# Patient Record
Sex: Male | Born: 1965 | Race: White | Hispanic: No | Marital: Married | Smoking: Current every day smoker
Health system: Southern US, Community
[De-identification: ages and names within clinical notes are randomized; demographics above are authoritative.]

## PROBLEM LIST (undated history)

## (undated) DIAGNOSIS — H5462 Unqualified visual loss, left eye, normal vision right eye: Secondary | ICD-10-CM

## (undated) HISTORY — PX: KNEE SURGERY: SHX244

## (undated) HISTORY — PX: RIB PLATING: SHX5079

## (undated) HISTORY — PX: STOMACH SURGERY: SHX791

## (undated) HISTORY — PX: ARM WOUND REPAIR / CLOSURE: SUR1141

## (undated) HISTORY — PX: BACK SURGERY: SHX140

## (undated) HISTORY — DX: Unqualified visual loss, left eye, normal vision right eye: H54.62

---

## 2006-01-19 ENCOUNTER — Encounter: Admission: RE | Admit: 2006-01-19 | Discharge: 2006-01-19 | Payer: Self-pay | Admitting: Neurosurgery

## 2015-09-15 DIAGNOSIS — E782 Mixed hyperlipidemia: Secondary | ICD-10-CM | POA: Insufficient documentation

## 2015-09-15 DIAGNOSIS — K279 Peptic ulcer, site unspecified, unspecified as acute or chronic, without hemorrhage or perforation: Secondary | ICD-10-CM

## 2015-09-15 DIAGNOSIS — I2510 Atherosclerotic heart disease of native coronary artery without angina pectoris: Secondary | ICD-10-CM

## 2015-09-15 DIAGNOSIS — N529 Male erectile dysfunction, unspecified: Secondary | ICD-10-CM | POA: Insufficient documentation

## 2015-09-15 HISTORY — DX: Peptic ulcer, site unspecified, unspecified as acute or chronic, without hemorrhage or perforation: K27.9

## 2015-09-15 HISTORY — DX: Atherosclerotic heart disease of native coronary artery without angina pectoris: I25.10

## 2015-09-16 DIAGNOSIS — T63461A Toxic effect of venom of wasps, accidental (unintentional), initial encounter: Secondary | ICD-10-CM | POA: Insufficient documentation

## 2015-09-16 HISTORY — DX: Toxic effect of venom of wasps, accidental (unintentional), initial encounter: T63.461A

## 2017-03-29 DIAGNOSIS — S39012A Strain of muscle, fascia and tendon of lower back, initial encounter: Secondary | ICD-10-CM

## 2017-03-29 HISTORY — DX: Strain of muscle, fascia and tendon of lower back, initial encounter: S39.012A

## 2017-06-07 ENCOUNTER — Encounter (HOSPITAL_COMMUNITY): Payer: Self-pay

## 2017-06-07 ENCOUNTER — Emergency Department (HOSPITAL_COMMUNITY)
Admission: EM | Admit: 2017-06-07 | Discharge: 2017-06-07 | Disposition: A | Discharge status: Home / Self Care | Payer: Worker's Compensation | Attending: Emergency Medicine | Admitting: Emergency Medicine

## 2017-06-07 ENCOUNTER — Emergency Department (HOSPITAL_COMMUNITY): Payer: Worker's Compensation

## 2017-06-07 DIAGNOSIS — W230XXA Caught, crushed, jammed, or pinched between moving objects, initial encounter: Secondary | ICD-10-CM | POA: Diagnosis not present

## 2017-06-07 DIAGNOSIS — Y92530 Ambulatory surgery center as the place of occurrence of the external cause: Secondary | ICD-10-CM | POA: Insufficient documentation

## 2017-06-07 DIAGNOSIS — S62640A Nondisplaced fracture of proximal phalanx of right index finger, initial encounter for closed fracture: Secondary | ICD-10-CM | POA: Diagnosis not present

## 2017-06-07 DIAGNOSIS — Y9389 Activity, other specified: Secondary | ICD-10-CM | POA: Diagnosis not present

## 2017-06-07 DIAGNOSIS — Y990 Civilian activity done for income or pay: Secondary | ICD-10-CM | POA: Insufficient documentation

## 2017-06-07 DIAGNOSIS — S6991XA Unspecified injury of right wrist, hand and finger(s), initial encounter: Secondary | ICD-10-CM | POA: Diagnosis present

## 2017-06-07 MED ORDER — OXYCODONE-ACETAMINOPHEN 5-325 MG PO TABS: 1.0000 | ORAL_TABLET | ORAL | 0 refills | Status: AC | PRN

## 2017-06-07 MED ORDER — OXYCODONE-ACETAMINOPHEN 5-325 MG PO TABS
1.0000 | ORAL_TABLET | Freq: Once | ORAL | Status: AC
  Administered 2017-06-07: 1 via ORAL
  Filled 2017-06-07: qty 1

## 2017-06-07 NOTE — ED Notes (Signed)
Patient transported to X-ray 

## 2017-06-07 NOTE — ED Notes (Signed)
Pt verbalized understanding of discharge instructions and denies any further questions at this time.   

## 2017-06-07 NOTE — Consult Note (Signed)
Reason for Consult:Finger injury Referring Physician: E Sheffield SliderWentz  Lucas Chase is an 52 y.o. male.  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 He came to the ED where x-rays showed a comminuted proximal phalanx fx of his index finger and a small avulsion of his middle phalanx and hand surgery was consulted. 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  History reviewed. No pertinent past medical history.  History reviewed. No pertinent surgical history.  No family history on file.  Social History:  has no tobacco, alcohol, and drug history on file.  Allergies: Not on File  Medications: I have reviewed the patient's current medications.  No results found for this or any previous visit (from the past 48 hour(s)).  Dg Hand Complete Right  Result Date: 06/07/2017 CLINICAL DATA:  Work injury with pain at right index finger and swelling. EXAM: RIGHT HAND - COMPLETE 3+ VIEW COMPARISON:  None. FINDINGS: Soft tissue swelling centered about the proximal phalanx of the second digit. Oblique, nondisplaced fracture with longitudinal components, most apparent on the lateral view. No definite intra-articular extension. Favor degenerative osseous irregularity about the volar portion of the proximal portion of the middle phalanx of the second digit. IMPRESSION: Complex, relatively nondisplaced longitudinal and oblique fractures involving the proximal phalanx of the second digit. Overlying soft tissue swelling. Electronically Signed   By: Jeronimo GreavesKyle  Talbot M.D.   On: 06/07/2017 10:09    Review of Systems  Constitutional: Negative for weight loss.  HENT: Negative for ear discharge, ear pain, hearing loss and tinnitus.   Eyes: Negative for blurred vision, double vision, photophobia and pain.  Respiratory: Negative for cough, sputum production and shortness of breath.   Cardiovascular: Negative for chest pain.  Gastrointestinal:  Negative for abdominal pain, nausea and vomiting.  Genitourinary: Negative for dysuria, flank pain, frequency and urgency.  Musculoskeletal: Positive for joint pain (Right index finger). Negative for back pain, falls, myalgias and neck pain.  Neurological: Negative for dizziness, tingling, sensory change, focal weakness, loss of consciousness and headaches.  Endo/Heme/Allergies: Does not bruise/bleed easily.  Psychiatric/Behavioral: Negative for depression, memory loss and substance abuse. The patient is not nervous/anxious.    Blood pressure (!) 135/94, pulse 77, temperature 98.6 F (37 C), temperature source Oral, resp. rate 18, SpO2 100 %. Physical Exam  Constitutional: He appears well-developed and well-nourished. No distress.  HENT:  Head: Normocephalic and atraumatic.  Eyes: Conjunctivae are normal. Right eye exhibits no discharge. Left eye exhibits no discharge. No scleral icterus.  Neck: Normal range of motion.  Cardiovascular: Normal rate and regular rhythm.  Respiratory: Effort normal. No respiratory distress.  Musculoskeletal:  Right shoulder, elbow, wrist, digits- no skin wounds, index finger prox phalanx swollen, mod TTP, extensor central slip not functional, no instability, no blocks to motion  Sens  Ax/R/M/U intact except index finger paresthetic  Mot   Ax/ R/ PIN/ M/ AIN/ U intact  Rad 2+  Neurological: He is alert.  Skin: Skin is warm and dry. He is not diaphoretic.  Psychiatric: He has a normal mood and affect. His behavior is normal.    Assessment/Plan: Right index finger proximal phalanx fx -- Splint and f/u with Dr. Melvyn Novasrtmann as OP. No use of the right hand.    Lucas CaldronMichael J. Montasia Chisenhall, PA-C Orthopedic Surgery [phone] 06/07/2017, 11:38 AM

## 2017-06-07 NOTE — Progress Notes (Signed)
Orthopedic Tech Progress Note Patient Details:  Daryel GeraldJarrett W Wiater 1965-04-12 [id number]  Ortho Devices Type of Ortho Device: Finger splint Ortho Device/Splint Location: rue Ortho Device/Splint Interventions: Application   Post Interventions Patient Tolerated: Well Instructions Provided: Care of device   Nikki DomCrawford, Damieon Armendariz 06/07/2017, 11:52 AM

## 2017-06-07 NOTE — ED Notes (Signed)
Hand Surgery PA Jeffery at bedside.   Ortho paged.

## 2017-06-07 NOTE — ED Provider Notes (Signed)
[facility] EMERGENCY DEPARTMENT Provider Note   CSN: [id number] Arrival date & time: 06/07/17  0911     History   Chief Complaint No chief complaint on file.   HPI Lucas Chase is a 52 y.o. male here for evaluation of sudden onset pain to the base of the right second finger associated with decreased range of motion secondary to pain. Also noted some bruising and swelling to the area. Patient was working at [employer] when a metal piece of equipment smashed his hand in this area. Denies previous history of injuries to this hand. He is right-hand dominant. No interventions PTA.  HPI  History reviewed. No pertinent past medical history.  There are no active problems to display for this patient.   History reviewed. No pertinent surgical history.     Home Medications    Prior to Admission medications   Medication Sig Start Date End Date Taking? Authorizing Provider  oxyCODONE-acetaminophen (PERCOCET/ROXICET) 5-325 MG tablet Take 1 tablet by mouth every 4 (four) hours as needed for up to 2 days for severe pain. 06/07/17 06/09/17  Liberty Handy, PA-C    Family History No family history on file.  Social History Social History   Tobacco Use  . Smoking status: Not on file  Substance Use Topics  . Alcohol use: Not on file  . Drug use: Not on file     Allergies   Patient has no allergy information on record.   Review of Systems Review of Systems  Musculoskeletal: Positive for arthralgias and joint swelling.  Skin: Positive for color change.  All other systems reviewed and are negative.    Physical Exam Updated Vital Signs BP (!) 135/94 (BP Location: Left Arm)   Pulse 77   Temp 98.6 F (37 C) (Oral)   Resp 18   SpO2 100%   Physical Exam  Constitutional: He is oriented to person, place, and time. He appears well-developed and well-nourished.  Non-toxic appearance. No distress.  NAD.  HENT:  Head:  Normocephalic and atraumatic.  Right Ear: External ear normal.  Left Ear: External ear normal.  Nose: Nose normal.  Eyes: Conjunctivae and EOM are normal. No scleral icterus.  Neck: Normal range of motion and full passive range of motion without pain. Neck supple.  Cardiovascular: Normal rate, regular rhythm, normal heart sounds and intact distal pulses.  No murmur heard. Good cap refill to fingers bilaterally.   Pulmonary/Chest: Effort normal and breath sounds normal. No tachypnea. No respiratory distress. He has no wheezes.  Musculoskeletal: Normal range of motion. He exhibits edema and tenderness. He exhibits no deformity.  Focal tenderness, mild edema and ecchymosis to 2nd proximal phalanx and MCP. Decreased range of motion to right 2nd finger and unable to flex at IP and MCP secondary to pain. No tenderness to other MCPs or metacarpals. No bony tenderness to right wrist.   Neurological: He is alert and oriented to person, place, and time.  Sensation to light touch intact in right thumb, decreased in entire right index finger.   Skin: Skin is warm and dry. Capillary refill takes less than 2 seconds.  Psychiatric: He has a normal mood and affect. His behavior is normal. Judgment and thought content normal.  Nursing note and vitals reviewed.    ED Treatments / Results  Labs (all labs ordered are listed, but only abnormal results are displayed) Labs Reviewed - No data to display  EKG  EKG Interpretation None  Radiology Dg Hand Complete Right  Result Date: 06/07/2017 CLINICAL DATA:  Work injury with pain at right index finger and swelling. EXAM: RIGHT HAND - COMPLETE 3+ VIEW COMPARISON:  None. FINDINGS: Soft tissue swelling centered about the proximal phalanx of the second digit. Oblique, nondisplaced fracture with longitudinal components, most apparent on the lateral view. No definite intra-articular extension. Favor degenerative osseous irregularity about the volar portion of  the proximal portion of the middle phalanx of the second digit. IMPRESSION: Complex, relatively nondisplaced longitudinal and oblique fractures involving the proximal phalanx of the second digit. Overlying soft tissue swelling. Electronically Signed   By: Jeronimo GreavesKyle  Talbot M.D.   On: 06/07/2017 10:09    Procedures Procedures (including critical care time)  Medications Ordered in ED Medications  oxyCODONE-acetaminophen (PERCOCET/ROXICET) 5-325 MG per tablet 1 tablet (1 tablet Oral Given 06/07/17 1023)     Initial Impression / Assessment and Plan / ED Course  I have reviewed the triage vital signs and the nursing notes.  Pertinent labs & imaging results that were available during my care of the patient were reviewed by me and considered in my medical decision making (see chart for details).  Clinical Course as of Jun 07 1149  Wed Jun 07, 2017  1022 IMPRESSION: Complex, relatively nondisplaced longitudinal and oblique fractures involving the proximal phalanx of the second digit. Overlying soft tissue swelling. DG Hand Complete Right [CG]  1028 FINDINGS: Soft tissue swelling centered about the proximal phalanx of the second digit. Oblique, nondisplaced fracture with longitudinal components, most apparent on the lateral view. No definite intra-articular extension. Favor degenerative osseous irregularity about the volar portion of the proximal portion of the middle phalanx of the second digit.  DG Hand Complete Right [CG]    Clinical Course User Index [CG] Liberty HandyGibbons, Maitri Schnoebelen J, PA-C    Pt evaluated by ortho PA Leotis ShamesJeffery who recommends splint and f/u in office. Will dc with pain control. Discussed needed follow up with patient who verbalized understanding and agreeable.   Final Clinical Impressions(s) / ED Diagnoses   Final diagnoses:  Closed nondisplaced fracture of proximal phalanx of right index finger, initial encounter    ED Discharge Orders        Ordered    oxyCODONE-acetaminophen  (PERCOCET/ROXICET) 5-325 MG tablet  Every 4 hours PRN     06/07/17 1150       Liberty HandyGibbons, Donice Alperin J, [place]PA-C 06/07/17 1151    Mancel BaleWentz, Elliott, MD 06/07/17 2228

## 2017-06-07 NOTE — ED Triage Notes (Signed)
Patient complains of right hand pain after smashing same in metal boom. No obvious deformity, full ROM

## 2017-06-07 NOTE — ED Notes (Signed)
ED Provider at bedside. 

## 2017-06-07 NOTE — Discharge Instructions (Addendum)
Keep splint intact and dry.  Keep hand elevated whenever possible.  Ice to finger for 30 minutes 4x/day.  Percocet for pain every 4 hours. Can add an additional 650 mg tylenol every 6 hours for better pain control.   Follow up with hand surgery.

## 2017-07-11 DIAGNOSIS — M25641 Stiffness of right hand, not elsewhere classified: Secondary | ICD-10-CM

## 2017-07-11 HISTORY — DX: Stiffness of right hand, not elsewhere classified: M25.641

## 2018-09-13 DIAGNOSIS — L309 Dermatitis, unspecified: Secondary | ICD-10-CM

## 2018-09-13 DIAGNOSIS — L0100 Impetigo, unspecified: Secondary | ICD-10-CM

## 2018-09-13 HISTORY — DX: Dermatitis, unspecified: L30.9

## 2018-09-13 HISTORY — DX: Impetigo, unspecified: L01.00

## 2019-07-23 IMAGING — DX DG HAND COMPLETE 3+V*R*
4 series · 4 of 4 positions shown · non-contrast
Comparison: None.

CLINICAL DATA: Work injury with pain at right index finger and
swelling.

EXAM:
RIGHT HAND - COMPLETE 3+ VIEW

[hand pa]
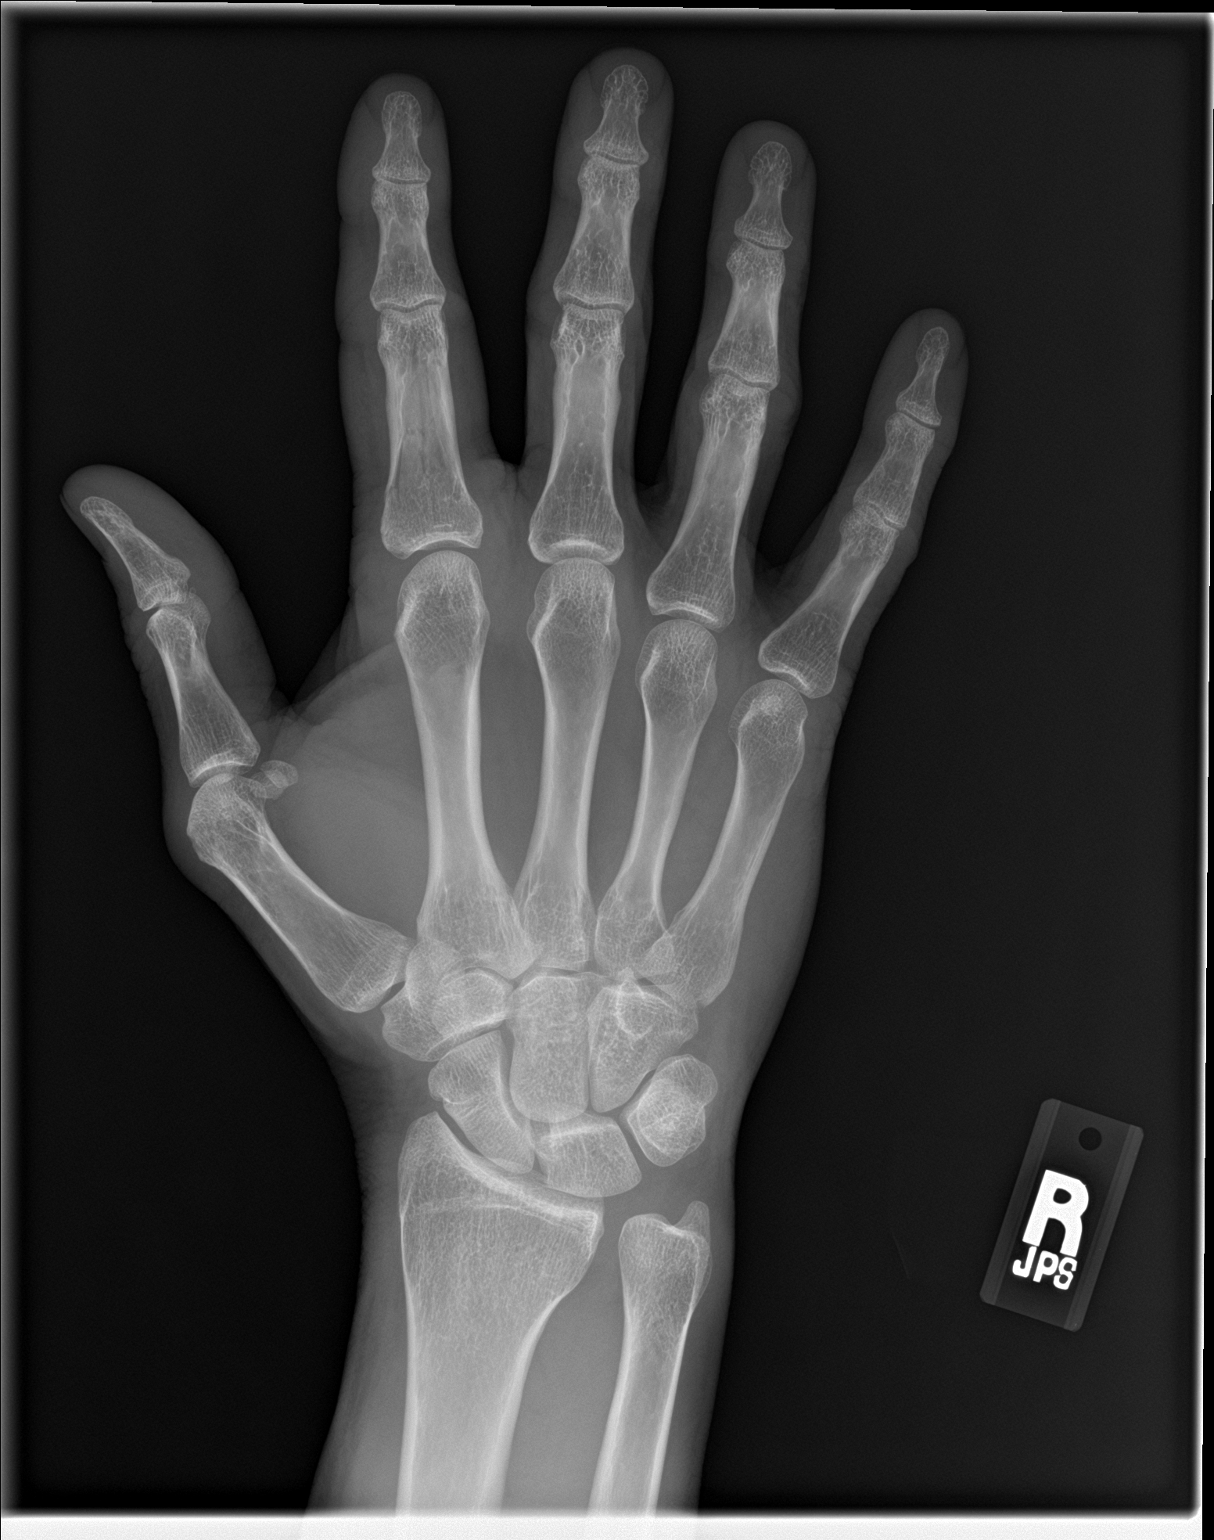

[hand obl]
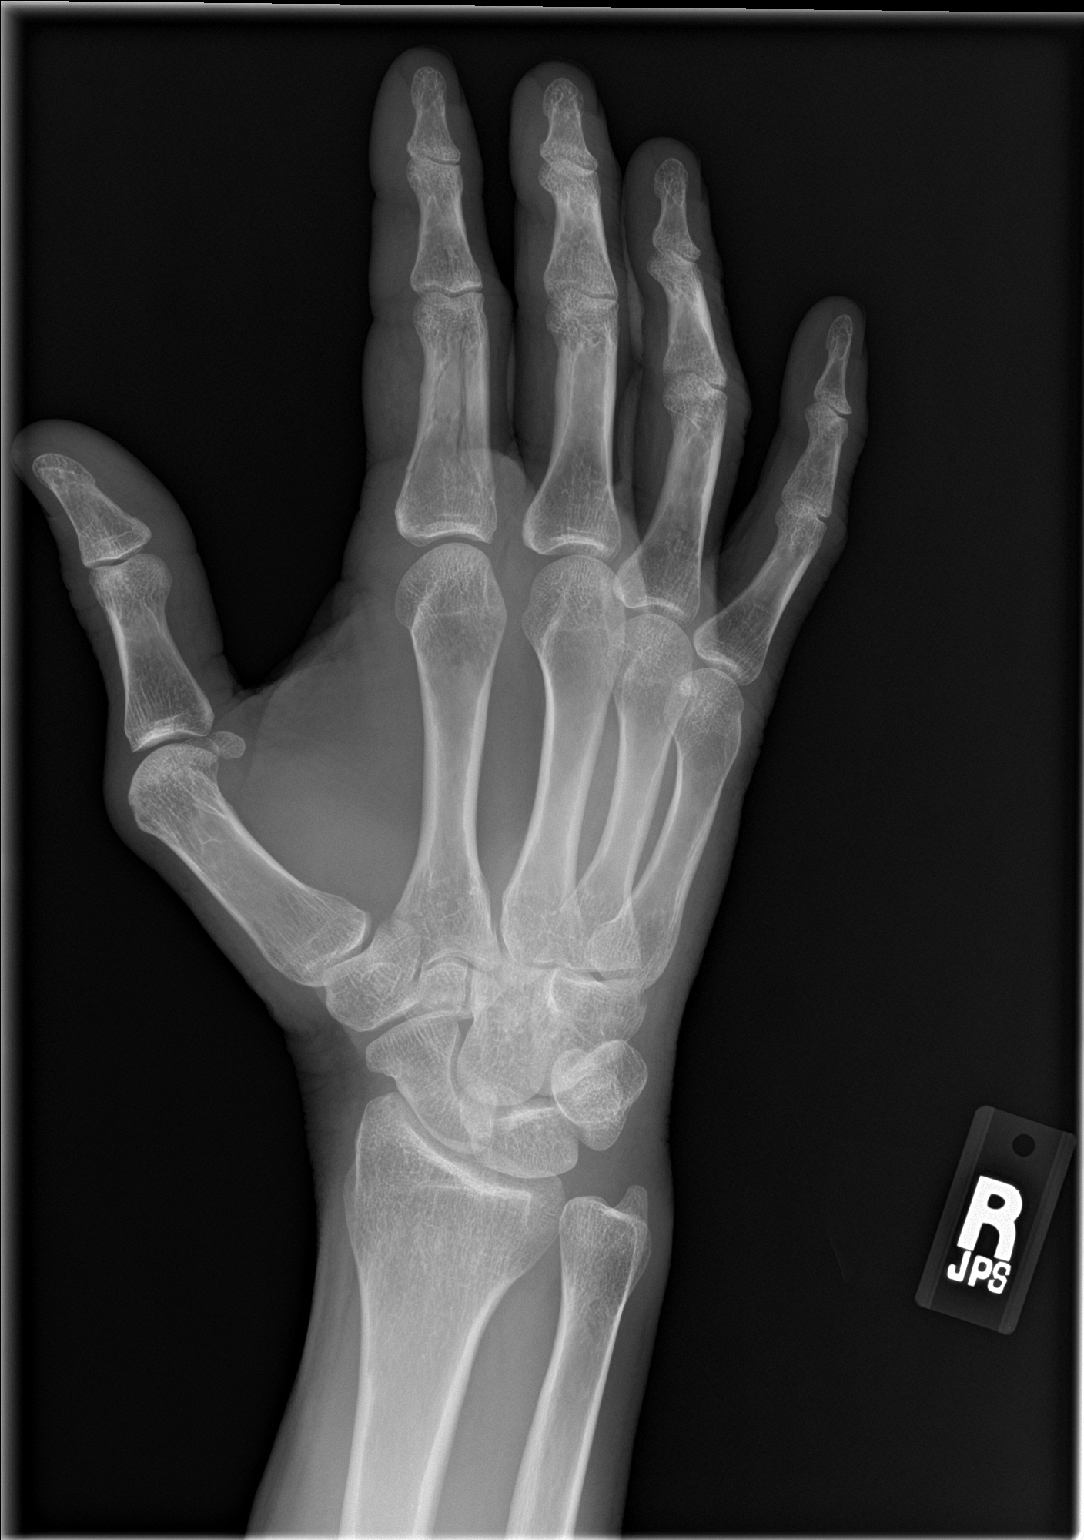

[hand lat (1 of 2)]
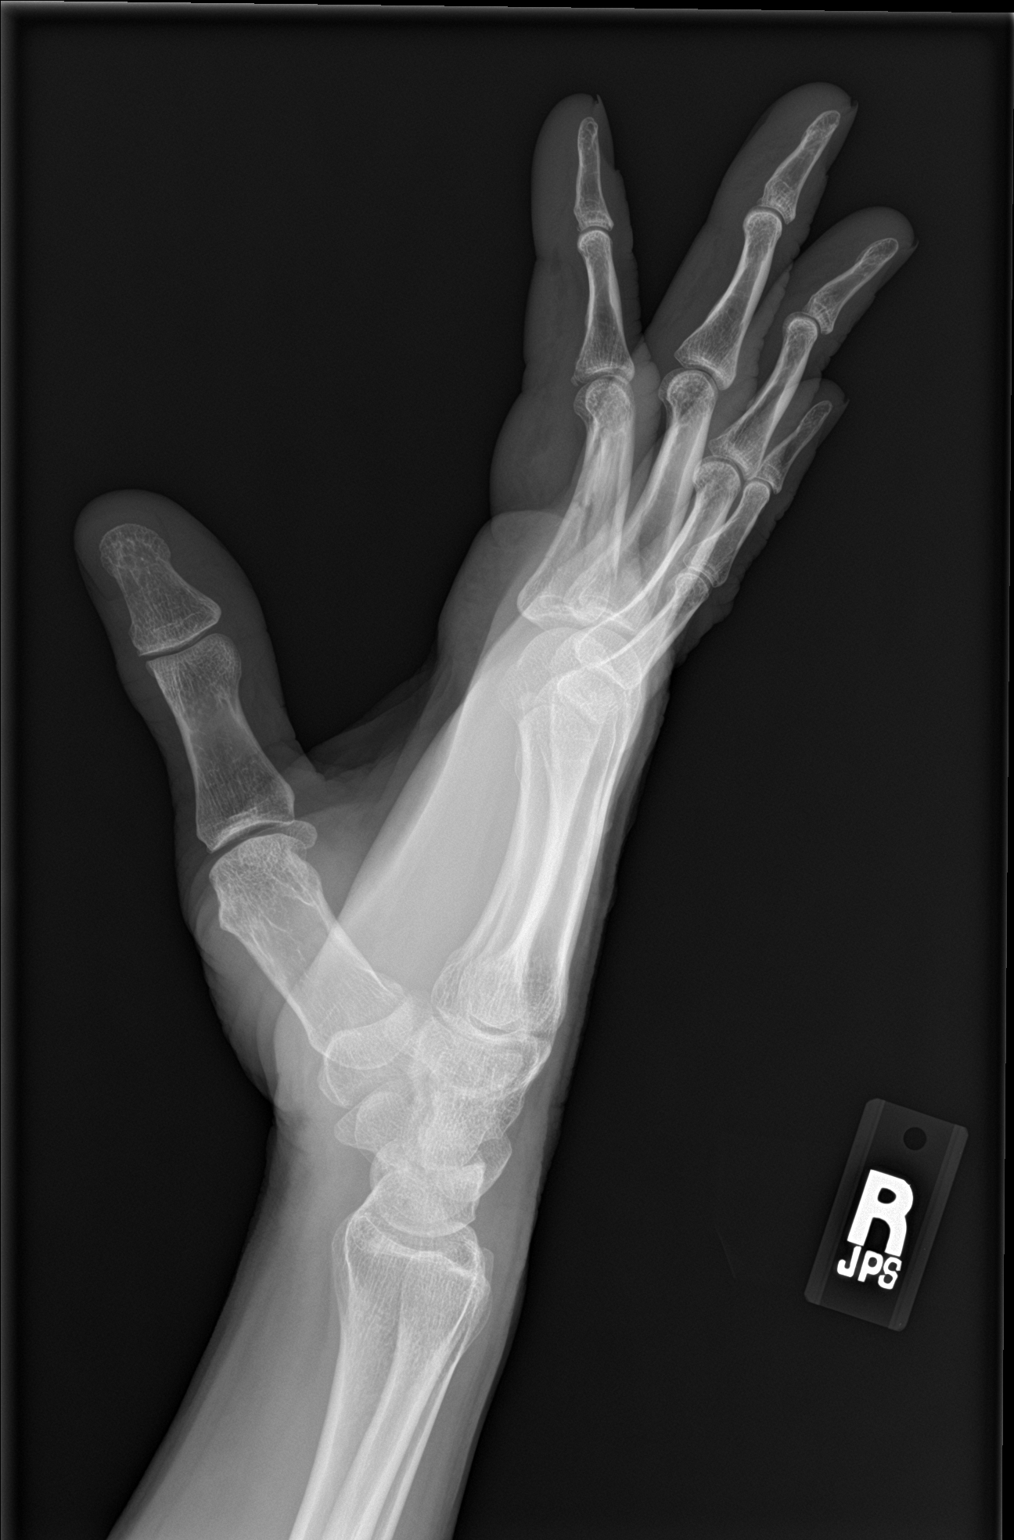

[hand lat (2 of 2)]
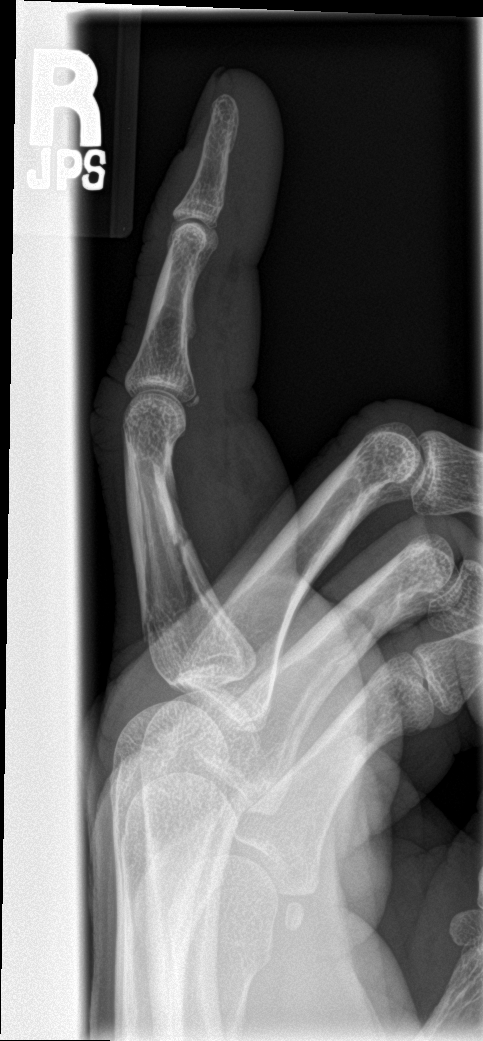

[4 of 4 positions shown; findings below may reference images not displayed]

FINDINGS: Soft tissue swelling centered about the proximal phalanx of the
second digit. Oblique, nondisplaced fracture with longitudinal
components, most apparent on the lateral view. No definite
intra-articular extension. Favor degenerative osseous irregularity
about the volar portion of the proximal portion of the middle
phalanx of the second digit.
IMPRESSION: Complex, relatively nondisplaced longitudinal and oblique fractures
involving the proximal phalanx of the second digit. Overlying soft
tissue swelling.

## 2019-12-03 DIAGNOSIS — R1310 Dysphagia, unspecified: Secondary | ICD-10-CM | POA: Insufficient documentation

## 2019-12-03 DIAGNOSIS — Z0000 Encounter for general adult medical examination without abnormal findings: Secondary | ICD-10-CM

## 2019-12-03 DIAGNOSIS — Z131 Encounter for screening for diabetes mellitus: Secondary | ICD-10-CM | POA: Insufficient documentation

## 2019-12-03 HISTORY — DX: Encounter for general adult medical examination without abnormal findings: Z00.00

## 2019-12-03 HISTORY — DX: Dysphagia, unspecified: R13.10

## 2020-01-27 DIAGNOSIS — K2960 Other gastritis without bleeding: Secondary | ICD-10-CM

## 2020-01-27 DIAGNOSIS — K222 Esophageal obstruction: Secondary | ICD-10-CM

## 2020-01-27 HISTORY — DX: Other gastritis without bleeding: K29.60

## 2020-01-27 HISTORY — DX: Esophageal obstruction: K22.2

## 2020-08-26 DIAGNOSIS — S39012A Strain of muscle, fascia and tendon of lower back, initial encounter: Secondary | ICD-10-CM

## 2020-08-26 DIAGNOSIS — S8002XA Contusion of left knee, initial encounter: Secondary | ICD-10-CM

## 2020-08-26 HISTORY — DX: Strain of muscle, fascia and tendon of lower back, initial encounter: S39.012A

## 2020-08-26 HISTORY — DX: Contusion of left knee, initial encounter: S80.02XA

## 2022-02-22 ENCOUNTER — Other Ambulatory Visit: Payer: Self-pay

## 2022-02-22 ENCOUNTER — Emergency Department (HOSPITAL_COMMUNITY)
Admission: EM | Admit: 2022-02-22 | Discharge: 2022-02-22 | Disposition: A | Discharge status: Home / Self Care | Payer: 59 | Attending: Emergency Medicine | Admitting: Emergency Medicine

## 2022-02-22 ENCOUNTER — Encounter (HOSPITAL_COMMUNITY): Payer: Self-pay

## 2022-02-22 DIAGNOSIS — I471 Supraventricular tachycardia, unspecified: Secondary | ICD-10-CM | POA: Diagnosis not present

## 2022-02-22 DIAGNOSIS — R42 Dizziness and giddiness: Secondary | ICD-10-CM | POA: Diagnosis present

## 2022-02-22 LAB — COMPREHENSIVE METABOLIC PANEL
ALT: 21 U/L (ref 0–44)
AST: 34 U/L (ref 15–41)
Albumin: 3.7 g/dL (ref 3.5–5.0)
Alkaline Phosphatase: 65 U/L (ref 38–126)
Anion gap: 12 (ref 5–15)
BUN: 7 mg/dL (ref 6–20)
CO2: 22 mmol/L (ref 22–32)
Calcium: 8.9 mg/dL (ref 8.9–10.3)
Chloride: 105 mmol/L (ref 98–111)
Creatinine, Ser: 1.05 mg/dL (ref 0.61–1.24)
GFR, Estimated: >60 mL/min (ref >60)
Glucose, Bld: 100 mg/dL — ABNORMAL HIGH (ref 70–99)
Potassium: 4.7 mmol/L (ref 3.5–5.1)
Sodium: 139 mmol/L (ref 135–145)
Total Bilirubin: 0.5 mg/dL (ref 0.3–1.2)
Total Protein: 6.4 g/dL — ABNORMAL LOW (ref 6.5–8.1)

## 2022-02-22 LAB — CBC
HCT: 42.7 % (ref 39.0–52.0)
Hemoglobin: 13.9 g/dL (ref 13.0–17.0)
MCH: 33.6 pg (ref 26.0–34.0)
MCHC: 32.6 g/dL (ref 30.0–36.0)
MCV: 103.1 fL — ABNORMAL HIGH (ref 80.0–100.0)
Platelets: 201 10*3/uL (ref 150–400)
RBC: 4.14 MIL/uL — ABNORMAL LOW (ref 4.22–5.81)
RDW: 13.8 % (ref 11.5–15.5)
WBC: 8.1 10*3/uL (ref 4.0–10.5)
nRBC: 0 % (ref 0.0–0.2)

## 2022-02-22 LAB — TROPONIN I (HIGH SENSITIVITY): Troponin I (High Sensitivity): 11 ng/L (ref <18)

## 2022-02-22 LAB — TSH: TSH: 1.077 u[IU]/mL (ref 0.350–4.500)

## 2022-02-22 NOTE — Discharge Instructions (Signed)
Get help right away if: You have chest pain. Your symptoms get worse. You have trouble breathing. You have an episode of SVT that lasts longer than 20 minutes. You faint. These symptoms may represent a serious problem that is an emergency. Do not wait to see if the symptoms will go away. Get medical help right away. Call your local emergency services (911 in the [place] Do not drive yourself to the hospital.

## 2022-02-22 NOTE — ED Provider Notes (Signed)
[facility] EMERGENCY DEPARTMENT Provider Note   CSN: [id number] Arrival date & time: 02/22/22  1152     History  Chief Complaint  Patient presents with   Dizziness    At work had sudden onset of dizziness and felt like he was going to have a syncopal episode. Upon EMS arrival HR was in 190s. Pt spontaneously converted prior to med administration    DAMARIEN Chase is a 56 y.o. male with no significant past medical history.Lucas Chase  History is gathered by the patient and EMS upon arrival.  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  He turned off his computer and then asked his foreman to walk with him to the nurses office.  He states that as he was walking down the hall he had onset of severe and progressively worsening chest tightness and pressure along with pain retrosternally and sensation of acid reflux and heartburn.  He states that he also felt quite short of breath.  By the time he got to the office nurse took his vital signs and called EMS immediately.  EMS reports that upon arrival his heart rate was noted to be 199 bpm and he appeared to be in SVT.  Initial blood pressure was 110.  As they were wheeling him out of the office the patient got startled by something and spontaneously converted to normal sinus rhythm.  He has no previous history of the same.  He had unsweetened tea at breakfast this morning.  He drinks no other caffeine takes no stimulants.  Drinks alcohol only very occasionally and does not smoke.  He also denies any illicit drug use or other stimulant use   Dizziness      Home Medications Prior to Admission medications   Not on File      Allergies    Patient has no known allergies.    Review of Systems   Review of Systems  Neurological:  Positive for dizziness.    Physical Exam Updated Vital Signs BP (!) 133/96   Pulse 79   Temp 98.8  F (37.1 C) (Oral)   Resp 17   Ht 6\' 4"  (1.93 m)   Wt 81.6 kg   SpO2 98%   BMI 21.91 kg/m  Physical Exam Vitals and nursing note reviewed.  Constitutional:      General: He is not in acute distress.    Appearance: He is well-developed. He is not diaphoretic.  HENT:     Head: Normocephalic and atraumatic.  Eyes:     General: No scleral icterus.    Conjunctiva/sclera: Conjunctivae normal.  Cardiovascular:     Rate and Rhythm: Normal rate and regular rhythm.     Heart sounds: Normal heart sounds.  Pulmonary:     Effort: Pulmonary effort is normal. No respiratory distress.     Breath sounds: Normal breath sounds.  Abdominal:     Palpations: Abdomen is soft.     Tenderness: There is no abdominal tenderness.  Musculoskeletal:     Cervical back: Normal range of motion and neck supple.  Skin:    General: Skin is warm and dry.  Neurological:     Mental Status: He is alert.  Psychiatric:        Behavior: Behavior normal.     ED Results / Procedures / Treatments   Labs (all labs ordered are listed, but only abnormal  results are displayed) Labs Reviewed  COMPREHENSIVE METABOLIC PANEL - Abnormal; Notable for the following components:      Result Value   Glucose, Bld 100 (*)    Total Protein 6.4 (*)    All other components within normal limits  CBC - Abnormal; Notable for the following components:   RBC 4.14 (*)    MCV 103.1 (*)    All other components within normal limits  TSH  ETHANOL  TROPONIN I (HIGH SENSITIVITY)  TROPONIN I (HIGH SENSITIVITY)    EKG EKG Interpretation  Date/Time:  Tuesday February 22 2022 11:57:22 EST Ventricular Rate:  93 PR Interval:  156 QRS Duration: 82 QT Interval:  360 QTC Calculation: 448 R Axis:   57 Text Interpretation: Sinus rhythm Anteroseptal infarct, old No old tracing to compare Confirmed by Jacalyn Lefevre [phone]) on 02/22/2022 12:05:10 PM  Radiology No results found.  Procedures Procedures    Medications Ordered in  ED Medications - No data to display  ED Course/ Medical Decision Making/ A&P Clinical Course as of 02/22/22 1634  Tue Feb 22, 2022  1456 Troponin I (High Sensitivity) [AH]  1456 Comprehensive metabolic panel(!) [AH]    Clinical Course User Index [AH] Arthor Captain, PA-C                           Medical Decision Making Patient presents the emergency department with chief complaint of chest pain The emergent differential diagnosis of chest pain includes: Acute coronary syndrome, pericarditis, aortic dissection, pulmonary embolism, tension pneumothorax, pneumonia, and esophageal rupture.    After review of all data points I suspect the patient had an episode of SVT given his heart rate was 199.  I also suspect that his anginal symptoms were secondary to demand ischemia. I have interpreted all lab results.  TSH within normal limits.  CBC and CMP without significant abnormality.  EKG shows normal sinus rhythm at a rate of 93.  Troponin is within normal limits.  He has had no recurrence of symptoms, no chest pain, no shortness of breath.  Discussed vagal maneuvers with the patient.  He is given ambulatory referral to cardiology for further evaluation and management.  Discussed return precautions.  Amount and/or Complexity of Data Reviewed Labs: ordered. Decision-making details documented in ED Course.           Final Clinical Impression(s) / ED Diagnoses Final diagnoses:  SVT (supraventricular tachycardia)    Rx / DC Orders ED Discharge Orders          Ordered    Ambulatory referral to Cardiology       Comments: If you have not heard from the Cardiology office within the next 72 hours please call [phone].   02/22/22 1517              Arthor Captain, PA-C 02/22/22 1635    Jacalyn Lefevre, MD 02/23/22 [phone]

## 2022-02-22 NOTE — ED Notes (Signed)
Help get patient into a gown on the monitor patient is resting with call bell in reach 

## 2022-03-10 ENCOUNTER — Other Ambulatory Visit: Payer: Self-pay

## 2022-03-14 ENCOUNTER — Ambulatory Visit: Payer: 59 | Attending: Cardiology | Admitting: Cardiology

## 2022-03-14 ENCOUNTER — Telehealth: Payer: Self-pay | Admitting: Cardiology

## 2022-03-14 ENCOUNTER — Encounter: Payer: Self-pay | Admitting: Cardiology

## 2022-03-14 VITALS — BP 124/72 | HR 105 | Ht 76.0 in | Wt 176.4 lb

## 2022-03-14 DIAGNOSIS — R0609 Other forms of dyspnea: Secondary | ICD-10-CM | POA: Diagnosis not present

## 2022-03-14 DIAGNOSIS — I471 Supraventricular tachycardia, unspecified: Secondary | ICD-10-CM

## 2022-03-14 DIAGNOSIS — E785 Hyperlipidemia, unspecified: Secondary | ICD-10-CM

## 2022-03-14 DIAGNOSIS — K279 Peptic ulcer, site unspecified, unspecified as acute or chronic, without hemorrhage or perforation: Secondary | ICD-10-CM

## 2022-03-14 DIAGNOSIS — I2510 Atherosclerotic heart disease of native coronary artery without angina pectoris: Secondary | ICD-10-CM | POA: Diagnosis not present

## 2022-03-14 DIAGNOSIS — R079 Chest pain, unspecified: Secondary | ICD-10-CM

## 2022-03-14 HISTORY — DX: Hyperlipidemia, unspecified: E78.5

## 2022-03-14 HISTORY — DX: Supraventricular tachycardia, unspecified: I47.10

## 2022-03-14 MED ORDER — CLOPIDOGREL BISULFATE 75 MG PO TABS: 75.0000 mg | ORAL_TABLET | Freq: Every day | ORAL | 3 refills | Status: DC

## 2022-03-14 MED ORDER — METOPROLOL SUCCINATE ER 50 MG PO TB24: 50.0000 mg | ORAL_TABLET | Freq: Every day | ORAL | 3 refills | Status: DC

## 2022-03-14 MED ORDER — NITROGLYCERIN 0.4 MG SL SUBL: 0.4000 mg | SUBLINGUAL_TABLET | SUBLINGUAL | 6 refills | Status: DC | PRN

## 2022-03-14 NOTE — Progress Notes (Signed)
Cardiology Consultation:    Date:  03/14/2022   ID:  Lucas Chase, DOB Dec 09, 1965, MRN [id number]  PCP:  Olive Bass, MD  Cardiologist:  Gypsy Balsam, MD   Referring MD: Arthor Captain, PA-C   Chief Complaint  Patient presents with   svt    Seen at ER    History of Present Illness:    Lucas Chase is a 56 y.o. male who is being seen today for the evaluation of SVT, chest pain at the request of Arthor Captain, PA-C.  Past medical history significant for coronary artery disease apparently 2014 he had cardiac catheterization done which showed only 20% RCA and 20% LAD lesion.  No therapy has been initiated.  Interestingly he has not been taking aspirin, he has not been taking any statin.  He has been doing quite well with no difficulties he does have also remote history of gastrectomy done secondary to bleeding ulcer he also got portion of his duodenum taken away.  Few weeks ago he was working at [employer], started feeling poorly.  Eventually end up going to the nurse his blood pressure initially was very high but then very low he also started sweating and having chest tightness.  Ambulance was summoned, when he came apparently he was in SVT.  While driving to the hospital he converted to sinus rhythm in the hospital EKG did not show any acute changes, troponins were negative he was discharged home.  Since that time doing well.  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  Described patient still fatigue and shortness of breath.  Does not smoke, does have family history of premature coronary artery disease however  Past Medical History:  Diagnosis Date   Acute lumbar myofascial strain 03/29/2017   Overview: 03/2017   Contusion of knee, left 08/26/2020   Formatting of this note might be different from the original. 08/26/2020 : work related    Coronary artery disease 09/15/2015   Overview: 2014: CATH - RCA 20%, LAD 20%   Dysphagia 12/03/2019   Formatting of this note might be different from the original. 2021   Eczema 09/13/2018   Esophageal stricture 01/27/2020   Formatting of this note might be different from the original. 01/2020: EGD/dil   Gastritis, bile acid reflux 01/27/2020   Formatting of this note might be different from the original. 2021: EGD   Hyperlipidemia, mixed 09/15/2015   Impetigo 09/13/2018   Lumbar strain, initial encounter 08/26/2020   Formatting of this note might be different from the original. 08/26/2020: left   Peptic ulcer disease 09/15/2015   Overview: 1981: obstruction, vagotomy with pyloroplasty   Stiffness of finger joint of right hand 07/11/2017   Wasp sting-induced anaphylaxis 09/16/2015   Wellness examination 12/03/2019    History reviewed. No pertinent surgical history.  Current Medications: Current Meds  Medication Sig   benzonatate (TESSALON) 200 MG capsule Take 200 mg by mouth 2 (two) times daily as needed for cough.   clopidogrel (PLAVIX) 75 MG tablet Take 1 tablet (75 mg total) by mouth daily.   EPINEPHrine 0.3 mg/0.3 mL IJ SOAJ injection Inject 0.3 mg into the muscle as needed for anaphylaxis.   metoprolol succinate (TOPROL-XL) 50 MG 24 hr tablet Take 1 tablet (50 mg total) by mouth daily. Take with or immediately following a meal.   [  DISCONTINUED] aspirin 81 MG chewable tablet Chew 81 mg by mouth daily.   [DISCONTINUED] dicyclomine (BENTYL) 20 MG tablet Take 20 mg by mouth 3 (three) times daily before meals.   [DISCONTINUED] pantoprazole (PROTONIX) 40 MG tablet Take 40 mg by mouth daily.     Allergies:   Wasp venom protein   Social History   Socioeconomic History   Marital status: Married    Spouse name: Not on file   Number of children: Not on file   Years of education: Not on file   Highest education level: Not on file  Occupational History   Not on file  Tobacco Use    Smoking status: Former    Types: Cigarettes   Smokeless tobacco: Current  Substance and Sexual Activity   Alcohol use: Not on file   Drug use: Not on file   Sexual activity: Not on file  Other Topics Concern   Not on file  Social History Narrative   Not on file   Social Determinants of Health   Financial Resource Strain: Not on file  Food Insecurity: Not on file  Transportation Needs: Not on file  Physical Activity: Not on file  Stress: Not on file  Social Connections: Not on file     Family History: The patient's family history is not on file. ROS:   Please see the history of present illness.    All 14 point review of systems negative except as described per history of present illness.  EKGs/Labs/Other Studies Reviewed:    The following studies were reviewed today: I did review EKG and from the hospital as well as blood test which were normal  EKG:  EKG is  ordered today.  The ekg ordered today demonstrates normal sinus rhythm normal P interval normal QS complex duration morphology criteria for LVH, nonspecific ST segment changes  Recent Labs: 02/22/2022: ALT 21; BUN 7; Creatinine, Ser 1.05; Hemoglobin 13.9; Platelets 201; Potassium 4.7; Sodium 139; TSH 1.077  Recent Lipid Panel No results found for: "CHOL", "TRIG", "HDL", "CHOLHDL", "VLDL", "LDLCALC", "LDLDIRECT"  Physical Exam:    VS:  BP 124/72 (BP Location: Left Arm, Patient Position: Sitting)   Pulse (!) 105   Ht 6\' 4"  (1.93 m)   Wt 176 lb 6.4 oz (80 kg)   SpO2 98%   BMI 21.47 kg/m     Wt Readings from Last 3 Encounters:  03/14/22 176 lb 6.4 oz (80 kg)  02/22/22 180 lb (81.6 kg)     GEN:  Well nourished, well developed in no acute distress HEENT: Normal NECK: No JVD; No carotid bruits LYMPHATICS: No lymphadenopathy CARDIAC: RRR, no murmurs, no rubs, no gallops RESPIRATORY:  Clear to auscultation without rales, wheezing or rhonchi  ABDOMEN: Soft, non-tender, non-distended MUSCULOSKELETAL:  No  edema; No deformity  SKIN: Warm and dry NEUROLOGIC:  Alert and oriented x 3 PSYCHIATRIC:  Normal affect   ASSESSMENT:    1. Chest pain of uncertain etiology   2. Dyspnea on exertion   3. Dyslipidemia   4. Coronary artery disease involving native coronary artery of native heart without angina pectoris   5. SVT (supraventricular tachycardia)   6. Peptic ulcer disease    PLAN:    In order of problems listed above:  Chest pain which is very worrisome in this gentleman history of coronary artery disease.  I will schedule him to have coronary CT angio to see if he got any reactivation of the problem in the meantime we will start him  on Plavix and the choice of Plavix is secondary to the fact that he does have history of peripheral vascular disease that required some gastrectomy, I will give him prescription for nitroglycerin as well as metoprolol succinate 50 mg daily.  He was given instruction how to use nitroglycerin and instructed to go to the emergency room if nitroglycerin does not help with the pain. Dyslipidemia I will do direct LDL today to see how aggressive I need to be with statin. Supraventricular tachycardia will initiate metoprolol long-acting hopefully will suppress the arrhythmia. Dyspnea on exertion echocardiogram will be done to check left ventricle ejection fraction.   Medication Adjustments/Labs and Tests Ordered: Current medicines are reviewed at length with the patient today.  Concerns regarding medicines are outlined above.  Orders Placed This Encounter  Procedures   CT CORONARY MORPH W/CTA COR W/SCORE W/CA W/CM &/OR WO/CM   LDL cholesterol, direct   EKG 12-Lead   ECHOCARDIOGRAM COMPLETE   Meds ordered this encounter  Medications   clopidogrel (PLAVIX) 75 MG tablet    Sig: Take 1 tablet (75 mg total) by mouth daily.    Dispense:  90 tablet    Refill:  3   metoprolol succinate (TOPROL-XL) 50 MG 24 hr tablet    Sig: Take 1 tablet (50 mg total) by mouth daily.  Take with or immediately following a meal.    Dispense:  90 tablet    Refill:  3    Signed, Georgeanna Lea, MD, [facility]. 03/14/2022 4:46 PM    [facility]

## 2022-03-14 NOTE — Telephone Encounter (Signed)
Nitroglycerin sent to pharmacy per Dr. Vanetta Shawl note

## 2022-03-14 NOTE — Telephone Encounter (Signed)
Pt c/o medication issue:  1. Name of Medication:   Nitroglycerin  2. How are you currently taking this medication (dosage and times per day)? Not taking  3. Are you having a reaction (difficulty breathing--STAT)? N/A  4. What is your medication issue?   Patient stated that during his office visit today he was supposed to be prescribed nitroglycerin.

## 2022-03-14 NOTE — Patient Instructions (Addendum)
Medication Instructions:   START: Plavix 75mg  1 daily  START: Metoprolol Succinate 50mg  1 daily  TAKE: Metoprolol 100mg  2 hours prior to CT Scan  START: Nitroglycerin Use nitroglycerin 1 tablet placed under the tongue at the first sign of chest pain or an angina attack. 1 tablet may be used every 5 minutes as needed, for up to 15 minutes. Do not take more than 3 tablets in 15 minutes. If pain persist call 911 or go to the nearest ED.    *If you need a refill on your cardiac medications before your next appointment, please call your pharmacy*   Lab Work:   Direct LDL- Today  Your physician recommends that you return for lab work in: 1 week prior to CT Scan. You can come Monday through Friday 8:30 am to 12:00 pm and 1:15 to 4:30. You do not need to make an appointment as the order has already been placed. The labs you are going to have done are BMET   Testing/Procedures: Your physician has requested that you have cardiac CT. Cardiac computed tomography (CT) is a painless test that uses an x-ray machine to take clear, detailed pictures of your heart. For further information please visit . Please follow instruction sheet as given.    Your Cardiac CT will be scheduled at:   [facility] located off [facility] at the hospital.  Please arrive 30 minutes prior to your appointment time.  You can use the FREE valet parking offered at entrance to outpatient center (encouraged to control the heart rate for the test)   Please follow these instructions carefully (unless otherwise directed):  Hold all erectile dysfunction medications at least 3 days (72 hrs) prior to test.  On the Night Before the Test: Be sure to Drink plenty of water. Do not consume any caffeinated/decaffeinated beverages or chocolate 12 hours prior to your test. Do not take any antihistamines 12 hours prior to your test.  On the Day of the Test: Drink plenty of water until 1  hour prior to the test. Do not eat any food 4 hours prior to the test. No smoking 4 hours prior to test. You may take your regular medications prior to the test.  Take metoprolol (Lopressor) two hours prior to test. Wear plain shirt no beads, sparkles, rhinestones, metal or heavy embroidery.  After the Test: Drink plenty of water. After receiving IV contrast, you may experience a mild flushed feeling. This is normal. On occasion, you may experience a mild rash up to 24 hours after the test. This is not dangerous. If this occurs, you can take Benadryl 25 mg and increase your fluid intake. If you experience trouble breathing, this can be serious. If it is severe call 911 IMMEDIATELY. If it is mild, please call our office. If you take any of these medications: Glipizide/Metformin, Avandament, Glucavance, please do not take 48 hours after completing test unless otherwise instructed.  We will call to schedule your test 2-4 weeks out understanding that some insurance companies will need an authorization prior to the service being performed.      Follow-Up: At [facility], you and your health needs are our priority.  As part of our continuing mission to provide you with exceptional heart care, we have created designated Provider Care Teams.  These Care Teams include your primary Cardiologist (physician) and Advanced Practice Providers (APPs -  Physician Assistants and Nurse Practitioners) who all work together to provide you with the care you  need, when you need it.  We recommend signing up for the patient portal called "MyChart".  Sign up information is provided on this After Visit Summary.  MyChart is used to connect with patients for Virtual Visits (Telemedicine).  Patients are able to view lab/test results, encounter notes, upcoming appointments, etc.  Non-urgent messages can be sent to your provider as well.   To learn more about what you can do with MyChart, go to  [email or web address].    Your next appointment:   2 month(s)  The format for your next appointment:   In Person  Provider:   Gypsy Balsam, MD    Other Instructions Cardiac CT Angiogram A cardiac CT angiogram is a procedure to look at the heart and the area around the heart. It may be done to help find the cause of chest pains or other symptoms of heart disease. During this procedure, a substance called contrast dye is injected into the blood vessels in the area to be checked. A large X-ray machine, called a CT scanner, then takes detailed pictures of the heart and the surrounding area. The procedure is also sometimes called a coronary CT angiogram, coronary artery scanning, or CTA. A cardiac CT angiogram allows the health care provider to see how well blood is flowing to and from the heart. The health care provider will be able to see if there are any problems, such as: Blockage or narrowing of the coronary arteries in the heart. Fluid around the heart. Signs of weakness or disease in the muscles, valves, and tissues of the heart. Tell a health care provider about: Any allergies you have. This is especially important if you have had a previous allergic reaction to contrast dye. All medicines you are taking, including vitamins, herbs, eye drops, creams, and over-the-counter medicines. Any blood disorders you have. Any surgeries you have had. Any medical conditions you have. Whether you are pregnant or may be pregnant. Any anxiety disorders, chronic pain, or other conditions you have that may increase your stress or prevent you from lying still. What are the risks? Generally, this is a safe procedure. However, problems may occur, including: Bleeding. Infection. Allergic reactions to medicines or dyes. Damage to other structures or organs. Kidney damage from the contrast dye that is used. Increased risk of cancer from radiation exposure. This risk is low. Talk with your health  care provider about: The risks and benefits of testing. How you can receive the lowest dose of radiation. What happens before the procedure? Wear comfortable clothing and remove any jewelry, glasses, dentures, and hearing aids. Follow instructions from your health care provider about eating and drinking. This may include: For 12 hours before the procedure -- avoid caffeine. This includes tea, coffee, soda, energy drinks, and diet pills. Drink plenty of water or other fluids that do not have caffeine in them. Being well hydrated can prevent complications. For 4-6 hours before the procedure -- stop eating and drinking. The contrast dye can cause nausea, but this is less likely if your stomach is empty. Ask your health care provider about changing or stopping your regular medicines. This is especially important if you are taking diabetes medicines, blood thinners, or medicines to treat problems with erections (erectile dysfunction). What happens during the procedure?  Hair on your chest may need to be removed so that small sticky patches called electrodes can be placed on your chest. These will transmit information that helps to monitor your heart during the procedure. An IV will be  inserted into one of your veins. You might be given a medicine to control your heart rate during the procedure. This will help to ensure that good images are obtained. You will be asked to lie on an exam table. This table will slide in and out of the CT machine during the procedure. Contrast dye will be injected into the IV. You might feel warm, or you may get a metallic taste in your mouth. You will be given a medicine called nitroglycerin. This will relax or dilate the arteries in your heart. The table that you are lying on will move into the CT machine tunnel for the scan. The person running the machine will give you instructions while the scans are being done. You may be asked to: Keep your arms above your head. Hold  your breath. Stay very still, even if the table is moving. When the scanning is complete, you will be moved out of the machine. The IV will be removed. The procedure may vary among health care providers and hospitals. What can I expect after the procedure? After your procedure, it is common to have: A metallic taste in your mouth from the contrast dye. A feeling of warmth. A headache from the nitroglycerin. Follow these instructions at home: Take over-the-counter and prescription medicines only as told by your health care provider. If you are told, drink enough fluid to keep your urine pale yellow. This will help to flush the contrast dye out of your body. Most people can return to their normal activities right after the procedure. Ask your health care provider what activities are safe for you. It is up to you to get the results of your procedure. Ask your health care provider, or the department that is doing the procedure, when your results will be ready. Keep all follow-up visits as told by your health care provider. This is important. Contact a health care provider if: You have any symptoms of allergy to the contrast dye. These include: Shortness of breath. Rash or hives. A racing heartbeat. Summary A cardiac CT angiogram is a procedure to look at the heart and the area around the heart. It may be done to help find the cause of chest pains or other symptoms of heart disease. During this procedure, a large X-ray machine, called a CT scanner, takes detailed pictures of the heart and the surrounding area after a contrast dye has been injected into blood vessels in the area. Ask your health care provider about changing or stopping your regular medicines before the procedure. This is especially important if you are taking diabetes medicines, blood thinners, or medicines to treat erectile dysfunction. If you are told, drink enough fluid to keep your urine pale yellow. This will help to flush  the contrast dye out of your body. This information is not intended to replace advice given to you by your health care provider. Make sure you discuss any questions you have with your health care provider. Document Revised: 07/08/2021 Document Reviewed: 11/14/2018 Elsevier Patient Education  2023 Elsevier Inc.  Your physician has requested that you have an echocardiogram. Echocardiography is a painless test that uses sound waves to create images of your heart. It provides your doctor with information about the size and shape of your heart and how well your heart's chambers and valves are working. This procedure takes approximately one hour. There are no restrictions for this procedure. Please do NOT wear cologne, perfume, aftershave, or lotions (deodorant is allowed). Please arrive 15 minutes  prior to your appointment time.    Important Information About Sugar

## 2022-03-14 NOTE — Addendum Note (Signed)
Addended by: Baldo Ash D on: 03/14/2022 05:08 PM   Modules accepted: Orders

## 2022-03-15 LAB — LDL CHOLESTEROL, DIRECT: LDL Direct: 212 mg/dL — ABNORMAL HIGH (ref 0–99)

## 2022-03-16 NOTE — Addendum Note (Signed)
Addended by: Baldo Ash D on: 03/16/2022 03:08 PM   Modules accepted: Orders

## 2022-03-17 LAB — BASIC METABOLIC PANEL
BUN/Creatinine Ratio: 13 (ref 9–20)
BUN: 22 mg/dL (ref 6–24)
CO2: 21 mmol/L (ref 20–29)
Calcium: 9.3 mg/dL (ref 8.7–10.2)
Chloride: 107 mmol/L — ABNORMAL HIGH (ref 96–106)
Creatinine, Ser: 1.63 mg/dL — ABNORMAL HIGH (ref 0.76–1.27)
Glucose: 63 mg/dL — ABNORMAL LOW (ref 70–99)
Potassium: 4.6 mmol/L (ref 3.5–5.2)
Sodium: 141 mmol/L (ref 134–144)
eGFR: 50 mL/min/{1.73_m2} — ABNORMAL LOW (ref >59)

## 2022-03-24 ENCOUNTER — Encounter: Payer: Self-pay | Admitting: Cardiology

## 2022-03-29 ENCOUNTER — Ambulatory Visit: Payer: 59 | Attending: Cardiology

## 2022-03-29 ENCOUNTER — Encounter: Payer: Self-pay | Admitting: Cardiology

## 2022-03-29 DIAGNOSIS — R0609 Other forms of dyspnea: Secondary | ICD-10-CM | POA: Diagnosis not present

## 2022-03-29 LAB — ECHOCARDIOGRAM COMPLETE
Area-P 1/2: 4.15 cm2
S' Lateral: 3.8 cm

## 2022-04-01 ENCOUNTER — Telehealth: Payer: Self-pay

## 2022-04-01 NOTE — Telephone Encounter (Signed)
LVM per DPR- per Dr. Krasowski's note regarding normal lab results. Encouraged to call with any questions or concerns.  

## 2022-05-17 ENCOUNTER — Encounter: Payer: Self-pay | Admitting: Cardiology

## 2022-05-17 ENCOUNTER — Ambulatory Visit: Payer: 59 | Attending: Cardiology | Admitting: Cardiology

## 2022-05-17 VITALS — BP 132/96 | HR 62 | Ht 76.0 in | Wt 182.6 lb

## 2022-05-17 DIAGNOSIS — E785 Hyperlipidemia, unspecified: Secondary | ICD-10-CM

## 2022-05-17 DIAGNOSIS — I2510 Atherosclerotic heart disease of native coronary artery without angina pectoris: Secondary | ICD-10-CM

## 2022-05-17 DIAGNOSIS — I471 Supraventricular tachycardia, unspecified: Secondary | ICD-10-CM | POA: Diagnosis not present

## 2022-05-17 MED ORDER — ATORVASTATIN CALCIUM 20 MG PO TABS: 20.0000 mg | ORAL_TABLET | Freq: Every day | ORAL | 3 refills | Status: DC

## 2022-05-17 MED ORDER — METOPROLOL SUCCINATE ER 50 MG PO TB24: 75.0000 mg | ORAL_TABLET | Freq: Every day | ORAL | 3 refills | Status: DC

## 2022-05-17 NOTE — Patient Instructions (Addendum)
Medication Instructions:  Your physician has recommended you make the following change in your medication:   Increase your Metoprolol to 75 mg daily.  Start Lipitor 20 mg daily.  *If you need a refill on your cardiac medications before your next appointment, please call your pharmacy*   Lab Work: Your physician recommends that you return for lab work in: 6 weeks You need to have labs done when you are fasting.  You can come Monday through Friday 8:30 am to 12:00 pm and 1:15 to 4:30. You do not need to make an appointment as the order has already been placed. The labs you are going to have done are AST, ALT and Lipids.  If you have labs (blood work) drawn today and your tests are completely normal, you will receive your results only by: [place] (if you have MyChart) OR A paper copy in the mail If you have any lab test that is abnormal or we need to change your treatment, we will call you to review the results.   Testing/Procedures: None ordered   Follow-Up: At [facility], you and your health needs are our priority.  As part of our continuing mission to provide you with exceptional heart care, we have created designated Provider Care Teams.  These Care Teams include your primary Cardiologist (physician) and Advanced Practice Providers (APPs -  Physician Assistants and Nurse Practitioners) who all work together to provide you with the care you need, when you need it.  We recommend signing up for the patient portal called "MyChart".  Sign up information is provided on this After Visit Summary.  MyChart is used to connect with patients for Virtual Visits (Telemedicine).  Patients are able to view lab/test results, encounter notes, upcoming appointments, etc.  Non-urgent messages can be sent to your provider as well.   To learn more about what you can do with MyChart, go to [email or web address].    Your next appointment:   6 month(s)  The format for your next  appointment:   In Person  Provider:   Jenne Campus, MD    Other Instructions none This visit was accompanied by Enrigue Catena.  Important Information About Sugar

## 2022-05-17 NOTE — Progress Notes (Unsigned)
This visit was accompanied by Enrigue Catena.

## 2022-05-17 NOTE — Progress Notes (Unsigned)
Cardiology Office Note:    Date:  05/17/2022   ID:  Lucas Chase, DOB [birth year], MRN [id number]  PCP:  Algis Greenhouse, MD  Cardiologist:  Jenne Campus, MD    Referring MD: Algis Greenhouse, MD   No chief complaint on file.   History of Present Illness:    Lucas Chase is a 57 y.o. male past medical history significant for coronary artery disease he did have cardiac catheterization in 2014 showing 20% RCA 20% LAD, recently he had coronary CT angio done which showed similar findings with only mild disease.  Additional problem include dyslipidemia with LDL more than 200 on top of that he does have history of supraventricular tachycardia another problem is drinking alcohol he drinks beer every day 6-8 beers every day at the meantime. He comes today to months for follow-up we will do cardiac wise doing well.  Denies of any chest pain tightness squeezing pressure burning chest.  He denies working.  [employer] and is very busy with it is very stressful job.  Past Medical History:  Diagnosis Date   Acute lumbar myofascial strain 03/29/2017   Overview: 03/2017   Contusion of knee, left 08/26/2020   Formatting of this note might be different from the original. 08/26/2020 : work related   Coronary artery disease 09/15/2015   Overview: 2014: CATH - RCA 20%, LAD 20%   Dysphagia 12/03/2019   Formatting of this note might be different from the original. 2021   Eczema 09/13/2018   Esophageal stricture 01/27/2020   Formatting of this note might be different from the original. 01/2020: EGD/dil   Gastritis, bile acid reflux 01/27/2020   Formatting of this note might be different from the original. 2021: EGD   Hyperlipidemia, mixed 09/15/2015   Impetigo 09/13/2018   Lumbar strain, initial encounter 08/26/2020   Formatting of this note might be different from the original. 08/26/2020: left   Peptic ulcer disease 09/15/2015   Overview: 1981: obstruction, vagotomy with pyloroplasty    Stiffness of finger joint of right hand 07/11/2017   Wasp sting-induced anaphylaxis 09/16/2015   Wellness examination 12/03/2019    Past Surgical History:  Procedure Laterality Date   KNEE SURGERY     STOMACH SURGERY      Current Medications: Current Meds  Medication Sig   clopidogrel (PLAVIX) 75 MG tablet Take 1 tablet (75 mg total) by mouth daily.   metoprolol succinate (TOPROL-XL) 50 MG 24 hr tablet Take 1 tablet (50 mg total) by mouth daily. Take with or immediately following a meal.   nitroGLYCERIN (NITROSTAT) 0.4 MG SL tablet Place 1 tablet (0.4 mg total) under the tongue every 5 (five) minutes as needed for chest pain.     Allergies:   Wasp venom protein   Social History   Socioeconomic History   Marital status: Married    Spouse name: Not on file   Number of children: Not on file   Years of education: Not on file   Highest education level: Not on file  Occupational History   Not on file  Tobacco Use   Smoking status: Former    Types: Cigarettes   Smokeless tobacco: Current  Substance and Sexual Activity   Alcohol use: Not on file   Drug use: Not on file   Sexual activity: Not on file  Other Topics Concern   Not on file  Social History Narrative   Not on file   Social Determinants of Health   Financial Resource  Strain: Not on file  Food Insecurity: Not on file  Transportation Needs: Not on file  Physical Activity: Not on file  Stress: Not on file  Social Connections: Not on file     Family History: The patient's family history includes Diabetes in his father and mother; Heart disease in his brother, father, and mother; Hypertension in his father and mother. There is no history of Cancer. ROS:   Please see the history of present illness.    All 14 point review of systems negative except as described per history of present illness  EKGs/Labs/Other Studies Reviewed:      Recent Labs: 02/22/2022: ALT 21; Hemoglobin 13.9; Platelets 201; TSH  1.077 03/16/2022: BUN 22; Creatinine, Ser 1.63; Potassium 4.6; Sodium 141  Recent Lipid Panel    Component Value Date/Time   LDLDIRECT 212 (H) 03/14/2022 1644    Physical Exam:    VS:  BP (!) 132/96   Pulse 62   Ht 6' 4"$  (1.93 m)   Wt 182 lb 9.6 oz (82.8 kg)   SpO2 98%   BMI 22.23 kg/m     Wt Readings from Last 3 Encounters:  05/17/22 182 lb 9.6 oz (82.8 kg)  03/14/22 176 lb 6.4 oz (80 kg)  02/22/22 180 lb (81.6 kg)     GEN:  Well nourished, well developed in no acute distress HEENT: Normal NECK: No JVD; No carotid bruits LYMPHATICS: No lymphadenopathy CARDIAC: RRR, no murmurs, no rubs, no gallops RESPIRATORY:  Clear to auscultation without rales, wheezing or rhonchi  ABDOMEN: Soft, non-tender, non-distended MUSCULOSKELETAL:  No edema; No deformity  SKIN: Warm and dry LOWER EXTREMITIES: no swelling NEUROLOGIC:  Alert and oriented x 3 PSYCHIATRIC:  Normal affect   ASSESSMENT:    1. Coronary artery disease involving native coronary artery of native heart without angina pectoris   2. SVT (supraventricular tachycardia)   3. Dyslipidemia    PLAN:    In order of problems listed above:  Coronary disease stable from that point we will continue antiplatelet therapy, he does have history of gastrectomy with peptic ulcer before he is on Plavix which I will continue. Supraventricular tachycardia still present to have some episodes are rare but still happening we will increase dose of metoprolol to 75 mg daily. Dyslipidemia his LDL is very high we will initiate him with Lipitor 20 mg daily, fasting lipid profile, AST LT will be done in 6 weeks.   Medication Adjustments/Labs and Tests Ordered: Current medicines are reviewed at length with the patient today.  Concerns regarding medicines are outlined above.  No orders of the defined types were placed in this encounter.  Medication changes: No orders of the defined types were placed in this encounter.   Signed, Park Liter, MD, [facility] 05/17/2022 4:54 PM    [place]

## 2022-05-31 ENCOUNTER — Other Ambulatory Visit: Payer: Self-pay

## 2022-05-31 ENCOUNTER — Telehealth: Payer: Self-pay | Admitting: Cardiology

## 2022-05-31 DIAGNOSIS — I2510 Atherosclerotic heart disease of native coronary artery without angina pectoris: Secondary | ICD-10-CM

## 2022-05-31 MED ORDER — METOPROLOL SUCCINATE ER 50 MG PO TB24: 75.0000 mg | ORAL_TABLET | Freq: Every day | ORAL | 3 refills | Status: DC

## 2022-05-31 MED ORDER — CLOPIDOGREL BISULFATE 75 MG PO TABS: 75.0000 mg | ORAL_TABLET | Freq: Every day | ORAL | 3 refills | Status: DC

## 2022-05-31 NOTE — Telephone Encounter (Signed)
*  STAT* If patient is at the pharmacy, call can be transferred to refill team.   1. Which medications need to be refilled? (please list name of each medication and dose if known) clopidogrel (PLAVIX) 75 MG tablet   metoprolol succinate (TOPROL-XL) 50 MG 24 hr tablet   2. Which pharmacy/location (including street and city if local pharmacy) is medication to be sent to?  [facility] [phone] - [place], [address] AT [place]    3. Do they need a 30 day or 90 day supply? [place]

## 2022-07-04 DIAGNOSIS — L0292 Furuncle, unspecified: Secondary | ICD-10-CM

## 2022-07-04 HISTORY — DX: Furuncle, unspecified: L02.92

## 2022-07-13 DIAGNOSIS — B029 Zoster without complications: Secondary | ICD-10-CM

## 2022-07-13 HISTORY — DX: Zoster without complications: B02.9

## 2022-07-28 DIAGNOSIS — N289 Disorder of kidney and ureter, unspecified: Secondary | ICD-10-CM

## 2022-07-28 HISTORY — DX: Disorder of kidney and ureter, unspecified: N28.9

## 2022-09-20 NOTE — Progress Notes (Signed)
 " Cardiology Office Note:    Date:  09/22/2022   ID:  Lucas Chase, DOB March 06, 1966, MRN [id number]  PCP:  Ofilia Lamar CROME, MD   [facility] Providers Cardiologist:  None     Referring MD: Ofilia Lamar CROME, MD   CC: follow up palpitations.   History of Present Illness:    Lucas Chase is a 57 y.o. 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  Presented to [facility] in November 2023 with an episode of SVT, heart rate reportedly to be 199 per EMS, converted upon arrival to the ED.  03/29/22 echo 60-65%, no valvular abnormalities. 03/22/2022 coronary CTA calcium  score of 797 placing the 90th percentile, CAD RADS of 2, multiple mixed plaques in the proximal and midportion of the RCA.  LAD with calcified plaque in the proximal portion with minimal stenosis of 0 to 25%, level of D1 large calcified plaque with mild stenosis 25 to 49%.  Most recently evaluated by Dr. Bernie on 05/17/2022, was doing well from a cardiac perspective.  His LDL was greater than 200, Lipitor was started.   He presents today accompanied by his wife for follow-up of palpitations, states it feels similar to when he had previous episode of SVT in the past.  He has been able to terminate these episodes with use of vagal maneuvers however is bothered by how frequently they are occurring.  He endorses an extremely high level of stress both related to his work life as well as his personal health.  He was diagnosed with shingles on his buttock and scrotum that has been excruciatingly painful for him.  He has been given antibiotics as well as antivirals, reports that the pain has been unbearable for him.  He is unable to sit or stand, constantly in pain.  He was started on gabapentin 3 times a day which has not touched the pain. He denies chest pain, dyspnea, pnd, orthopnea, n, v, dizziness, syncope, edema, weight gain, or early  satiety.   Past Medical History:  Diagnosis Date   Acute lumbar myofascial strain 03/29/2017   Overview: 03/2017   Contusion of knee, left 08/26/2020   Formatting of this note might be different from the original. 08/26/2020 : work related   Coronary artery disease 09/15/2015   Overview: 2014: CATH - RCA 20%, LAD 20%   Dysphagia 12/03/2019   Formatting of this note might be different from the original. 2021   Eczema 09/13/2018   Esophageal stricture 01/27/2020   Formatting of this note might be different from the original. 01/2020: EGD/dil   Gastritis, bile acid reflux 01/27/2020   Formatting of this note might be different from the original. 2021: EGD   Hyperlipidemia, mixed 09/15/2015   Impetigo 09/13/2018   Lumbar strain, initial encounter 08/26/2020   Formatting of this note might be different from the original. 08/26/2020: left   Peptic ulcer disease 09/15/2015   Overview: 1981: obstruction, vagotomy with pyloroplasty   Stiffness of finger joint of right hand 07/11/2017   Wasp sting-induced anaphylaxis 09/16/2015   Wellness examination 12/03/2019    Past Surgical History:  Procedure Laterality Date   KNEE SURGERY     STOMACH SURGERY      Current Medications: Current Meds  Medication Sig   atorvastatin  (LIPITOR) 20 MG tablet Take 1 tablet (20 mg total) by mouth daily.   clopidogrel  (PLAVIX ) 75 MG tablet Take 1 tablet (  75 mg total) by mouth daily.   gabapentin (NEURONTIN) 300 MG capsule Take 300 mg by mouth 3 (three) times daily.   gabapentin (NEURONTIN) 800 MG tablet Take 800 mg by mouth 3 (three) times daily.   metoprolol  succinate (TOPROL -XL) 100 MG 24 hr tablet Take 1 tablet (100 mg total) by mouth daily. Take with or immediately following a meal.   nitroGLYCERIN  (NITROSTAT ) 0.4 MG SL tablet Place 1 tablet (0.4 mg total) under the tongue every 5 (five) minutes as needed for chest pain.   [DISCONTINUED] metoprolol  succinate (TOPROL -XL) 50 MG 24 hr tablet Take 1.5  tablets (75 mg total) by mouth daily. Take with or immediately following a meal.     Allergies:   Wasp venom protein   Social History   Socioeconomic History   Marital status: Married    Spouse name: Not on file   Number of children: Not on file   Years of education: Not on file   Highest education level: Not on file  Occupational History   Not on file  Tobacco Use   Smoking status: Former    Types: Cigarettes   Smokeless tobacco: Current  Substance and Sexual Activity   Alcohol use: Not on file   Drug use: Not on file   Sexual activity: Not on file  Other Topics Concern   Not on file  Social History Narrative   Not on file   Social Determinants of Health   Financial Resource Strain: Not on file  Food Insecurity: Not on file  Transportation Needs: Not on file  Physical Activity: Not on file  Stress: Not on file  Social Connections: Not on file     Family History: The patient's family history includes Diabetes in his father and mother; Heart disease in his brother, father, and mother; Hypertension in his father and mother. There is no history of Cancer.  ROS:   Please see the history of present illness.     All other systems reviewed and are negative.  EKGs/Labs/Other Studies Reviewed:    The following studies were reviewed today: Cardiac Studies & Procedures       ECHOCARDIOGRAM  ECHOCARDIOGRAM COMPLETE 03/29/2022  Narrative ECHOCARDIOGRAM REPORT    Patient Name:   Lucas Chase Date of Exam: 03/29/2022 Medical Rec #:  [id number]          Height:       76.0 in Accession #:    [id number]         Weight:       176.4 lb Date of Birth:  March 09, 1966         BSA:          2.100 m Patient Age:    56 years           BP:           124/72 mmHg Patient Gender: M                  HR:           78 bpm. Exam Location:  [place]  Procedure: 2D Echo, Cardiac Doppler, Color Doppler and Strain Analysis  Indications:    Dyspnea on exertion [R06.09  (ICD-10-CM)]  History:        Patient has no prior history of Echocardiogram examinations. CAD; Risk Factors:Former Smoker. SVT (supraventricular tachycardia) [I47.10].  Sonographer:    Charlie Jointer RDCS Referring Phys: [id number] ROBERT J KRASOWSKI  IMPRESSIONS   1. GLS -19.7%. Left ventricular ejection  fraction, by estimation, is 60 to 65%. The left ventricle has normal function. The left ventricle has no regional wall motion abnormalities. Left ventricular diastolic parameters were normal. 2. Right ventricular systolic function is normal. The right ventricular size is normal. 3. The mitral valve is normal in structure. No evidence of mitral valve regurgitation. No evidence of mitral stenosis. 4. The aortic valve is normal in structure. Aortic valve regurgitation is not visualized. No aortic stenosis is present. 5. The inferior vena cava is normal in size with greater than 50% respiratory variability, suggesting right atrial pressure of 3 mmHg.  FINDINGS Left Ventricle: GLS -19.7%. Left ventricular ejection fraction, by estimation, is 60 to 65%. The left ventricle has normal function. The left ventricle has no regional wall motion abnormalities. The left ventricular internal cavity size was normal in size. There is no left ventricular hypertrophy. Left ventricular diastolic parameters were normal.  Right Ventricle: The right ventricular size is normal. No increase in right ventricular wall thickness. Right ventricular systolic function is normal.  Left Atrium: Left atrial size was normal in size.  Right Atrium: Right atrial size was normal in size.  Pericardium: There is no evidence of pericardial effusion.  Mitral Valve: The mitral valve is normal in structure. No evidence of mitral valve regurgitation. No evidence of mitral valve stenosis.  Tricuspid Valve: The tricuspid valve is normal in structure. Tricuspid valve regurgitation is not demonstrated. No evidence of tricuspid  stenosis.  Aortic Valve: The aortic valve is normal in structure. Aortic valve regurgitation is not visualized. No aortic stenosis is present.  Pulmonic Valve: The pulmonic valve was normal in structure. Pulmonic valve regurgitation is not visualized. No evidence of pulmonic stenosis.  Aorta: The aortic root is normal in size and structure.  Venous: The inferior vena cava is normal in size with greater than 50% respiratory variability, suggesting right atrial pressure of 3 mmHg.  IAS/Shunts: No atrial level shunt detected by color flow Doppler.   LEFT VENTRICLE PLAX 2D LVIDd:         4.90 cm   Diastology LVIDs:         3.80 cm   LV e' medial:    9.14 cm/s LV PW:         1.00 cm   LV E/e' medial:  5.6 LV IVS:        1.10 cm   LV e' lateral:   13.50 cm/s LVOT diam:     2.50 cm   LV E/e' lateral: 3.8 LV SV:         80 LV SV Index:   38 LVOT Area:     4.91 cm   RIGHT VENTRICLE             IVC RV Basal diam:  2.40 cm     IVC diam: 1.50 cm RV S prime:     14.30 cm/s TAPSE (M-mode): 2.4 cm  LEFT ATRIUM             Index        RIGHT ATRIUM          Index LA diam:        3.30 cm 1.57 cm/m   RA Area:     8.73 cm LA Vol (A2C):   49.0 ml 23.33 ml/m  RA Volume:   16.90 ml 8.05 ml/m LA Vol (A4C):   49.8 ml 23.71 ml/m LA Biplane Vol: 53.3 ml 25.38 ml/m AORTIC VALVE LVOT Vmax:   89.50 cm/s LVOT  Vmean:  61.100 cm/s LVOT VTI:    0.163 m  AORTA Ao Root diam: 4.40 cm Ao Desc diam: 2.30 cm  MITRAL VALVE MV Area (PHT): 4.15 cm    SHUNTS MV Decel Time: 183 msec    Systemic VTI:  0.16 m MV E velocity: 51.40 cm/s  Systemic Diam: 2.50 cm MV A velocity: 50.00 cm/s MV E/A ratio:  1.03  Raianna Slight Crape MD Electronically signed by Dyamond Tolosa Crape MD Signature Date/Time: 03/29/2022/4:58:49 PM    Final                 Recent Labs: 02/22/2022: ALT 21; Hemoglobin 13.9; Platelets 201; TSH 1.077 03/16/2022: BUN 22; Creatinine, Ser 1.63; Potassium 4.6; Sodium 141  Recent Lipid  Panel    Component Value Date/Time   LDLDIRECT 212 (H) 03/14/2022 1644     Risk Assessment/Calculations:                Physical Exam:    VS:  BP 116/80 (BP Location: Right Arm, Patient Position: Sitting, Cuff Size: Normal)   Pulse 70   Ht 6' 3 (1.905 m)   Wt 170 lb 6.4 oz (77.3 kg)   SpO2 97%   BMI 21.30 kg/m     Wt Readings from Last 3 Encounters:  09/22/22 170 lb 6.4 oz (77.3 kg)  05/17/22 182 lb 9.6 oz (82.8 kg)  03/14/22 176 lb 6.4 oz (80 kg)     GEN:  Well nourished, appears uncomfortable and unable to sit still  HEENT: Normal NECK: No JVD; No carotid bruits LYMPHATICS: No lymphadenopathy CARDIAC: RRR, no murmurs, rubs, gallops RESPIRATORY:  Clear to auscultation without rales, wheezing or rhonchi  ABDOMEN: Soft, non-tender, non-distended MUSCULOSKELETAL:  No edema; No deformity  SKIN: Warm and dry NEUROLOGIC:  Alert and oriented x 3 PSYCHIATRIC:  Normal affect   ASSESSMENT:    1. Palpitations   2. Coronary artery disease involving native coronary artery of native heart without angina pectoris   3. Dyslipidemia   4. SVT (supraventricular tachycardia)   5. Pain   6. Herpes zoster complication    PLAN:    In order of problems listed above:  Palpitations/SVT - Previous episode of SVT last year that converted prior to arrival at ED but captured by EMS. Will arrange for 2 week monitor as he feels he is having several of these episodes/weekly. Recent lab work unrevealing for causes. Will increase metoprolol  to 100 mg daily. Discussed vagal maneuvers and he is well versed in using them.  CAD - non-obstructive per coronary CTA in 2023. Stable with no anginal symptoms. No indication for ischemic evaluation.   HLD - most recent LDL was > 200, continue Crestor 20 mg daily. Will arrange for FLP and LFTs one day next week.  Herpes zoster - lesion has cleared but still has erythema, he is in excruciating pain as the lesion is reportedly on his left testicle and  wrapping to his left buttock. 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  Pain - diagnosed with shingles ~ 4 months ago, continues to have what sounds to be post-herpetic lesion pain. He is very stressed out over his pain and does not know what to do. He is on gabapentin and it is not touching his pain. Pain is interfering with his QOL and work. He has some concerns about what is being absorbed secondary to gastrectomy in the 90's. Will refer him to a local pain clinic  to see if they can offer any assistance.      Disposition - monitor x 2 weeks; refer to pain management; increase metoprolol  to 100 mg daily; return next week for FLP and LFTs. Return for follow up in 6 weeks.       Medication Adjustments/Labs and Tests Ordered: Current medicines are reviewed at length with the patient today.  Concerns regarding medicines are outlined above.  Orders Placed This Encounter  Procedures   Lipid panel   Comprehensive metabolic panel   Ambulatory referral to Pain Clinic   LONG TERM MONITOR (3-14 DAYS)   Meds ordered this encounter  Medications   metoprolol  succinate (TOPROL -XL) 100 MG 24 hr tablet    Sig: Take 1 tablet (100 mg total) by mouth daily. Take with or immediately following a meal.    Dispense:  90 tablet    Refill:  3    Patient Instructions  Medication Instructions:  Your physician has recommended you make the following change in your medication:  Change Metoprolol  to 100 mg once daily  *If you need a refill on your cardiac medications before your next appointment, please call your pharmacy*   Lab Work: Your physician recommends that you return for lab work in: 1 week come in for Fasting Lipid Panel and CMP Lab opens at 8am. You DO NOT NEED an appointment. Best time to come is between 8am and 12noon and between 1:30 and 4:30. If you have been asked to fast for your blood work please have nothing to eat or drink after  midnight. You may have water.   If you have labs (blood work) drawn today and your tests are completely normal, you will receive your results only by: MyChart Message (if you have MyChart) OR A paper copy in the mail If you have any lab test that is abnormal or we need to change your treatment, we will call you to review the results.   Testing/Procedures: You have been asked to wear a Zio Heart Monitor today. It is to be worn for 14 days. Please remove the monitor on July 4th and mail back in the box provided.  If you have any questions about the monitor please call the company at [phone]     Follow-Up: At [facility], you and your health needs are our priority.  As part of our continuing mission to provide you with exceptional heart care, we have created designated Provider Care Teams.  These Care Teams include your primary Cardiologist (physician) and Advanced Practice Providers (APPs -  Physician Assistants and Nurse Practitioners) who all work together to provide you with the care you need, when you need it.  We recommend signing up for the patient portal called MyChart.  Sign up information is provided on this After Visit Summary.  MyChart is used to connect with patients for Virtual Visits (Telemedicine).  Patients are able to view lab/test results, encounter notes, upcoming appointments, etc.  Non-urgent messages can be sent to your provider as well.   To learn more about what you can do with MyChart, go to [email or web address].    Your next appointment:   6 week(s)  Provider:   Lamar Fitch, MD    Other Instructions Google search for Vagal Maneuvers    Signed, Delon JAYSON Hoover, NP  09/22/2022 3:46 PM    [facility] "

## 2022-09-22 ENCOUNTER — Encounter: Payer: Self-pay | Admitting: Cardiology

## 2022-09-22 ENCOUNTER — Ambulatory Visit (INDEPENDENT_AMBULATORY_CARE_PROVIDER_SITE_OTHER): Payer: 59

## 2022-09-22 ENCOUNTER — Ambulatory Visit: Payer: 59 | Attending: Cardiology | Admitting: Cardiology

## 2022-09-22 VITALS — BP 116/80 | HR 70 | Ht 75.0 in | Wt 170.4 lb

## 2022-09-22 DIAGNOSIS — E785 Hyperlipidemia, unspecified: Secondary | ICD-10-CM | POA: Diagnosis not present

## 2022-09-22 DIAGNOSIS — I471 Supraventricular tachycardia, unspecified: Secondary | ICD-10-CM | POA: Diagnosis not present

## 2022-09-22 DIAGNOSIS — I2510 Atherosclerotic heart disease of native coronary artery without angina pectoris: Secondary | ICD-10-CM

## 2022-09-22 DIAGNOSIS — R002 Palpitations: Secondary | ICD-10-CM

## 2022-09-22 DIAGNOSIS — R52 Pain, unspecified: Secondary | ICD-10-CM

## 2022-09-22 DIAGNOSIS — B028 Zoster with other complications: Secondary | ICD-10-CM

## 2022-09-22 MED ORDER — METOPROLOL SUCCINATE ER 100 MG PO TB24: 100.0000 mg | ORAL_TABLET | Freq: Every day | ORAL | 3 refills | Status: DC

## 2022-09-22 NOTE — Patient Instructions (Addendum)
Medication Instructions:  Your physician has recommended you make the following change in your medication:  Change Metoprolol to 100 mg once daily  *If you need a refill on your cardiac medications before your next appointment, please call your pharmacy*   Lab Work: Your physician recommends that you return for lab work in: 1 week come in for Fasting Lipid Panel and CMP Lab opens at 8am. You DO NOT NEED an appointment. Best time to come is between 8am and 12noon and between 1:30 and 4:30. If you have been asked to fast for your blood work please have nothing to eat or drink after midnight. You may have water.   If you have labs (blood work) drawn today and your tests are completely normal, you will receive your results only by: MyChart Message (if you have MyChart) OR A paper copy in the mail If you have any lab test that is abnormal or we need to change your treatment, we will call you to review the results.   Testing/Procedures: You have been asked to wear a Zio Heart Monitor today. It is to be worn for 14 days. Please remove the monitor on July 4th and mail back in the box provided.  If you have any questions about the monitor please call the company at [phone]     Follow-Up: At [facility], you and your health needs are our priority.  As part of our continuing mission to provide you with exceptional heart care, we have created designated Provider Care Teams.  These Care Teams include your primary Cardiologist (physician) and Advanced Practice Providers (APPs -  Physician Assistants and Nurse Practitioners) who all work together to provide you with the care you need, when you need it.  We recommend signing up for the patient portal called "MyChart".  Sign up information is provided on this After Visit Summary.  MyChart is used to connect with patients for Virtual Visits (Telemedicine).  Patients are able to view lab/test results, encounter notes, upcoming appointments,  etc.  Non-urgent messages can be sent to your provider as well.   To learn more about what you can do with MyChart, go to [email or web address].    Your next appointment:   6 week(s)  Provider:   Gypsy Balsam, MD    Other Instructions Google search for Vagal Maneuvers

## 2022-09-29 ENCOUNTER — Telehealth: Payer: Self-pay

## 2022-09-29 ENCOUNTER — Encounter: Payer: Self-pay | Admitting: Cardiology

## 2022-09-29 DIAGNOSIS — I2510 Atherosclerotic heart disease of native coronary artery without angina pectoris: Secondary | ICD-10-CM

## 2022-09-29 DIAGNOSIS — E785 Hyperlipidemia, unspecified: Secondary | ICD-10-CM

## 2022-09-29 LAB — COMPREHENSIVE METABOLIC PANEL WITH GFR
ALT: 31 IU/L (ref 0–44)
AST: 54 IU/L — ABNORMAL HIGH (ref 0–40)
Albumin: 4.4 g/dL (ref 3.8–4.9)
Alkaline Phosphatase: 76 IU/L (ref 44–121)
BUN/Creatinine Ratio: 11 (ref 9–20)
BUN: 11 mg/dL (ref 6–24)
Bilirubin Total: 0.4 mg/dL (ref 0.0–1.2)
CO2: 22 mmol/L (ref 20–29)
Calcium: 9.3 mg/dL (ref 8.7–10.2)
Chloride: 100 mmol/L (ref 96–106)
Creatinine, Ser: 0.96 mg/dL (ref 0.76–1.27)
Globulin, Total: 2.4 g/dL (ref 1.5–4.5)
Glucose: 95 mg/dL (ref 70–99)
Potassium: 4.5 mmol/L (ref 3.5–5.2)
Sodium: 138 mmol/L (ref 134–144)
Total Protein: 6.8 g/dL (ref 6.0–8.5)
eGFR: 94 mL/min/1.73

## 2022-09-29 LAB — LIPID PANEL
Chol/HDL Ratio: 2.9 ratio (ref 0.0–5.0)
Cholesterol, Total: 202 mg/dL — ABNORMAL HIGH (ref 100–199)
HDL: 69 mg/dL
LDL Chol Calc (NIH): 106 mg/dL — ABNORMAL HIGH (ref 0–99)
Triglycerides: 156 mg/dL — ABNORMAL HIGH (ref 0–149)
VLDL Cholesterol Cal: 27 mg/dL (ref 5–40)

## 2022-09-29 MED ORDER — ATORVASTATIN CALCIUM 40 MG PO TABS: 40.0000 mg | ORAL_TABLET | Freq: Every day | ORAL | 3 refills | Status: DC

## 2022-09-29 NOTE — Telephone Encounter (Signed)
-----   Message from Flossie Dibble, NP sent at 09/29/2022  8:02 AM EDT ----- Mr. Masoud, Your bad cholesterol is 106, should be < 70. We need to increase your Lipitor to 40 mg daily. Return in 6 weeks for fasting lipid panel and repeat liver function testing.  Best, Victorino Dike

## 2022-10-26 ENCOUNTER — Telehealth: Payer: Self-pay | Admitting: *Deleted

## 2022-10-26 NOTE — Telephone Encounter (Signed)
-----   Message from Flossie Dibble sent at 10/26/2022  7:49 AM EDT ----- Monitor revealed mostly in sinus rhythm. You are having episodes of extra fast beats, similar to what you have had in the past. Nothing worrisome. Have you noticed them less since we increased your metoprolol?

## 2022-10-26 NOTE — Telephone Encounter (Signed)
Left message on machine for pt to increase metoprolol to 1 and 1/2 tablets daily (150 mg) Left number for pt to call back if he has any questions. Will not send in new Rx at this time. Will wait and see how pt does on new dose.

## 2022-10-26 NOTE — Telephone Encounter (Signed)
Spoke with pt about heart monitor results. Pt stated the Metoprolol helped a tiny bit and wondered if we could increase it a little more if medically acceptable. Wants call back when this is addressed.

## 2022-10-26 NOTE — Telephone Encounter (Signed)
Left message for pt to call back  °

## 2022-11-01 ENCOUNTER — Telehealth: Payer: Self-pay | Admitting: Cardiology

## 2022-11-01 NOTE — Telephone Encounter (Signed)
Aware that appointment has been cancelled.
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
## 2022-11-04 ENCOUNTER — Ambulatory Visit: Payer: 59 | Admitting: Cardiology

## 2022-11-08 DIAGNOSIS — F1020 Alcohol dependence, uncomplicated: Secondary | ICD-10-CM | POA: Insufficient documentation

## 2022-11-08 HISTORY — DX: Alcohol dependence, uncomplicated: F10.20

## 2022-11-11 DIAGNOSIS — F411 Generalized anxiety disorder: Secondary | ICD-10-CM | POA: Insufficient documentation

## 2022-11-11 HISTORY — DX: Generalized anxiety disorder: F41.1

## 2022-12-26 ENCOUNTER — Telehealth: Payer: Self-pay | Admitting: Cardiology

## 2022-12-26 DIAGNOSIS — E785 Hyperlipidemia, unspecified: Secondary | ICD-10-CM

## 2022-12-26 NOTE — Telephone Encounter (Signed)
Recommendations reviewed with pt as per [facility] note.  Pt verbalized understanding and had no additional questions.

## 2022-12-26 NOTE — Telephone Encounter (Signed)
*  STAT* If patient is at the pharmacy, call can be transferred to refill team.   1. Which medications need to be refilled? (please list name of each medication and dose if known) atorvastatin (LIPITOR) 80 MG tablet    2. Would you like to learn more about the convenience, safety, & potential cost savings by using the [facility]?    3. Are you open to using the [facility] (Type [facility].  ).   4. Which pharmacy/location (including street and city if local pharmacy) is medication to be sent to? [facility] [phone] - [place], [place] - [address]    5. Do they need a 30 day or 90 day supply? 90 day  Wife states last time he was in the office he was increased from 40mg  to 80 mg.
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
## 2022-12-27 ENCOUNTER — Telehealth: Payer: Self-pay

## 2022-12-27 NOTE — Telephone Encounter (Signed)
Medication reconciled per patient request however, Atorvastatin not changed until lab results come in per [facility] message on 12/27/2022

## 2022-12-28 LAB — HEPATIC FUNCTION PANEL
ALT: 15 IU/L (ref 0–44)
AST: 17 IU/L (ref 0–40)
Albumin: 3.8 g/dL (ref 3.8–4.9)
Alkaline Phosphatase: 185 IU/L — ABNORMAL HIGH (ref 44–121)
Bilirubin Total: <0.2 mg/dL (ref 0.0–1.2)
Bilirubin, Direct: <0.1 mg/dL (ref 0.00–0.40)
Total Protein: 6 g/dL (ref 6.0–8.5)

## 2022-12-28 LAB — LIPID PANEL
Chol/HDL Ratio: 5.2 ratio — ABNORMAL HIGH (ref 0.0–5.0)
Cholesterol, Total: 220 mg/dL — ABNORMAL HIGH (ref 100–199)
HDL: 42 mg/dL
LDL Chol Calc (NIH): 161 mg/dL — ABNORMAL HIGH (ref 0–99)
Triglycerides: 95 mg/dL (ref 0–149)
VLDL Cholesterol Cal: 17 mg/dL (ref 5–40)

## 2022-12-30 ENCOUNTER — Other Ambulatory Visit: Payer: Self-pay | Admitting: *Deleted

## 2022-12-30 MED ORDER — ATORVASTATIN CALCIUM 80 MG PO TABS: 80.0000 mg | ORAL_TABLET | Freq: Every day | ORAL | 3 refills | Status: DC
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
## 2022-12-30 NOTE — Addendum Note (Signed)
Addended by: Roosvelt Harps R on: 12/30/2022 01:05 PM   Modules accepted: Orders

## 2023-01-29 DIAGNOSIS — S27322A Contusion of lung, bilateral, initial encounter: Secondary | ICD-10-CM | POA: Insufficient documentation

## 2023-01-29 DIAGNOSIS — S2243XD Multiple fractures of ribs, bilateral, subsequent encounter for fracture with routine healing: Secondary | ICD-10-CM

## 2023-01-29 DIAGNOSIS — D62 Acute posthemorrhagic anemia: Secondary | ICD-10-CM | POA: Insufficient documentation

## 2023-01-29 DIAGNOSIS — T07XXXA Unspecified multiple injuries, initial encounter: Secondary | ICD-10-CM | POA: Insufficient documentation

## 2023-01-29 DIAGNOSIS — S129XXA Fracture of neck, unspecified, initial encounter: Secondary | ICD-10-CM

## 2023-01-29 DIAGNOSIS — S22050A Wedge compression fracture of T5-T6 vertebra, initial encounter for closed fracture: Secondary | ICD-10-CM | POA: Insufficient documentation

## 2023-01-29 DIAGNOSIS — I998 Other disorder of circulatory system: Secondary | ICD-10-CM | POA: Insufficient documentation

## 2023-01-29 DIAGNOSIS — S92901B Unspecified fracture of right foot, initial encounter for open fracture: Secondary | ICD-10-CM | POA: Insufficient documentation

## 2023-01-29 DIAGNOSIS — F1090 Alcohol use, unspecified, uncomplicated: Secondary | ICD-10-CM | POA: Insufficient documentation

## 2023-01-29 DIAGNOSIS — J939 Pneumothorax, unspecified: Secondary | ICD-10-CM | POA: Insufficient documentation

## 2023-01-29 DIAGNOSIS — S42116D Nondisplaced fracture of body of scapula, unspecified shoulder, subsequent encounter for fracture with routine healing: Secondary | ICD-10-CM

## 2023-01-29 DIAGNOSIS — R9082 White matter disease, unspecified: Secondary | ICD-10-CM | POA: Insufficient documentation

## 2023-01-29 DIAGNOSIS — M479 Spondylosis, unspecified: Secondary | ICD-10-CM | POA: Insufficient documentation

## 2023-01-29 DIAGNOSIS — I700 Atherosclerosis of aorta: Secondary | ICD-10-CM | POA: Insufficient documentation

## 2023-01-29 DIAGNOSIS — S42002D Fracture of unspecified part of left clavicle, subsequent encounter for fracture with routine healing: Secondary | ICD-10-CM | POA: Insufficient documentation

## 2023-01-29 HISTORY — DX: Alcohol use, unspecified, uncomplicated: F10.90

## 2023-01-29 HISTORY — DX: Unspecified multiple injuries, initial encounter: T07.XXXA

## 2023-01-29 HISTORY — DX: Spondylosis, unspecified: M47.9

## 2023-01-29 HISTORY — DX: Pneumothorax, unspecified: J93.9

## 2023-01-29 HISTORY — DX: Wedge compression fracture of T5-T6 vertebra, initial encounter for closed fracture: S22.050A

## 2023-01-29 HISTORY — DX: Other disorder of circulatory system: I99.8

## 2023-01-29 HISTORY — DX: Acute posthemorrhagic anemia: D62

## 2023-01-29 HISTORY — DX: Multiple fractures of ribs, bilateral, subsequent encounter for fracture with routine healing: S22.43XD

## 2023-01-29 HISTORY — DX: Atherosclerosis of aorta: I70.0

## 2023-01-29 HISTORY — DX: Nondisplaced fracture of body of scapula, unspecified shoulder, subsequent encounter for fracture with routine healing: S42.116D

## 2023-01-29 HISTORY — DX: Fracture of unspecified part of left clavicle, subsequent encounter for fracture with routine healing: S42.002D

## 2023-01-29 HISTORY — DX: Contusion of lung, bilateral, initial encounter: S27.322A

## 2023-01-29 HISTORY — DX: Fracture of neck, unspecified, initial encounter: S12.9XXA

## 2023-01-29 HISTORY — DX: Unspecified fracture of right foot, initial encounter for open fracture: S92.901B

## 2023-01-30 ENCOUNTER — Telehealth: Payer: Self-pay

## 2023-01-30 DIAGNOSIS — S069XAA Unspecified intracranial injury with loss of consciousness status unknown, initial encounter: Secondary | ICD-10-CM | POA: Insufficient documentation

## 2023-01-30 HISTORY — DX: Unspecified intracranial injury with loss of consciousness status unknown, initial encounter: S06.9XAA

## 2023-01-30 NOTE — Telephone Encounter (Signed)
   [facility] Pre-operative Risk Assessment    Request for surgical clearance:  What type of surgery is being performed? Extractions, Fillings, Crown/Bridge Work   When is this surgery scheduled? TBD   What type of clearance is required (medical clearance vs. Pharmacy clearance to hold med vs. Both)? Both  Are there any medications that need to be held prior to surgery and how long?Plavix(holding length not specified). The office is asking for precautions and/or premedication instructions.   Practice name and name of physician performing surgery? [facility]   What is your office phone number: [phone]    7.   What is your office fax number: [phone]  8.   Anesthesia type (None, local, MAC, general) ? Not specified   Lucas Chase 01/30/2023, 2:18 PM  _________________________________________________________________   (provider comments below)

## 2023-01-30 NOTE — Telephone Encounter (Signed)
Callback team please contact requesting provider office for more information regarding amount of teeth to be repaired for procedure.  Thank you

## 2023-01-31 NOTE — Telephone Encounter (Signed)
S/w the dental office and confirmed procedure: 2 CROWNS to be done at this time.   ANESTHESIA: LOCAL  DATE OF PROCEDURE: 02/09/23 THOUGH INDICATED ON FORM AS TBD

## 2023-01-31 NOTE — Telephone Encounter (Signed)
Patient Name: Lucas Chase  DOB: 1965-08-02 MRN: [id number]  Primary Cardiologist: Gypsy Balsam, MD  Chart reviewed as part of pre-operative protocol coverage.    Simple dental extractions (i.e. 1-2 teeth) are considered low risk procedures per guidelines and generally do not require any specific cardiac clearance. It is also generally accepted that for simple extractions and dental cleanings, there is no need to interrupt blood thinner therapy.  SBE prophylaxis is not required for the patient from a cardiac standpoint.  I will route this recommendation to the requesting party via Epic fax function and remove from pre-op pool.  Please call with questions.  Napoleon Form, Leodis Rains, NP 01/31/2023, 2:14 PM

## 2023-02-05 DIAGNOSIS — F1021 Alcohol dependence, in remission: Secondary | ICD-10-CM

## 2023-02-05 HISTORY — DX: Alcohol dependence, in remission: F10.21

## 2023-02-07 DIAGNOSIS — M545 Low back pain, unspecified: Secondary | ICD-10-CM | POA: Insufficient documentation

## 2023-02-07 HISTORY — DX: Low back pain, unspecified: M54.50

## 2023-02-23 ENCOUNTER — Telehealth: Payer: Self-pay | Admitting: Cardiology

## 2023-02-23 ENCOUNTER — Other Ambulatory Visit: Payer: Self-pay

## 2023-02-23 MED ORDER — CLOPIDOGREL BISULFATE 75 MG PO TABS: 75.0000 mg | ORAL_TABLET | Freq: Every day | ORAL | 3 refills | Status: DC

## 2023-02-23 MED ORDER — ATORVASTATIN CALCIUM 80 MG PO TABS: 80.0000 mg | ORAL_TABLET | Freq: Every day | ORAL | 3 refills | Status: DC

## 2023-02-23 MED ORDER — METOPROLOL SUCCINATE ER 100 MG PO TB24: 150.0000 mg | ORAL_TABLET | Freq: Every day | ORAL | 3 refills | Status: DC

## 2023-02-23 NOTE — Telephone Encounter (Signed)
Patient's wife coming in because insurance has requested they get 90 day prescriptions - If we could send in rx for atorvastatin, plavix, and metoprolol to [facility] on [address]   Running out of some of the meds in 3 days  Best number contact [phone] (wife) when this is completed.   Thank you

## 2023-02-27 DIAGNOSIS — H9313 Tinnitus, bilateral: Secondary | ICD-10-CM

## 2023-02-27 DIAGNOSIS — G894 Chronic pain syndrome: Secondary | ICD-10-CM

## 2023-02-27 HISTORY — DX: Tinnitus, bilateral: H93.13

## 2023-02-27 HISTORY — DX: Chronic pain syndrome: G89.4

## 2023-03-13 DIAGNOSIS — R079 Chest pain, unspecified: Secondary | ICD-10-CM | POA: Insufficient documentation

## 2023-03-13 DIAGNOSIS — I200 Unstable angina: Secondary | ICD-10-CM | POA: Insufficient documentation

## 2023-03-13 DIAGNOSIS — R001 Bradycardia, unspecified: Secondary | ICD-10-CM

## 2023-03-13 HISTORY — DX: Chest pain, unspecified: R07.9

## 2023-03-13 HISTORY — DX: Unstable angina: I20.0

## 2023-03-13 HISTORY — DX: Bradycardia, unspecified: R00.1

## 2023-03-28 ENCOUNTER — Ambulatory Visit: Payer: 59 | Attending: Cardiology | Admitting: Cardiology

## 2023-03-28 VITALS — BP 110/68 | HR 80 | Ht 76.0 in | Wt 180.6 lb

## 2023-03-28 DIAGNOSIS — I471 Supraventricular tachycardia, unspecified: Secondary | ICD-10-CM | POA: Diagnosis not present

## 2023-03-28 DIAGNOSIS — I2510 Atherosclerotic heart disease of native coronary artery without angina pectoris: Secondary | ICD-10-CM

## 2023-03-28 DIAGNOSIS — R002 Palpitations: Secondary | ICD-10-CM

## 2023-03-28 DIAGNOSIS — E785 Hyperlipidemia, unspecified: Secondary | ICD-10-CM

## 2023-03-28 MED ORDER — METOPROLOL SUCCINATE ER 50 MG PO TB24: 50.0000 mg | ORAL_TABLET | Freq: Every day | ORAL | 3 refills | Status: DC

## 2023-03-28 MED ORDER — NITROGLYCERIN 0.4 MG SL SUBL: 0.4000 mg | SUBLINGUAL_TABLET | SUBLINGUAL | 11 refills | Status: DC | PRN

## 2023-03-28 NOTE — Patient Instructions (Addendum)
Medication Instructions:   START: Metoprolol Succinate 50mg  1 tablet daily   Lab Work: Your physician recommends that you return for lab work in: 1 month You need to have labs done when you are fasting.  You can come Monday through Friday 8:30 am to 12:00 pm and 1:15 to 4:30. You do not need to make an appointment as the order has already been placed. The labs you are going to have done are AST, ALT and Lipids.    Testing/Procedures: None Ordered   Follow-Up: At [facility], you and your health needs are our priority.  As part of our continuing mission to provide you with exceptional heart care, we have created designated Provider Care Teams.  These Care Teams include your primary Cardiologist (physician) and Advanced Practice Providers (APPs -  Physician Assistants and Nurse Practitioners) who all work together to provide you with the care you need, when you need it.  We recommend signing up for the patient portal called "MyChart".  Sign up information is provided on this After Visit Summary.  MyChart is used to connect with patients for Virtual Visits (Telemedicine).  Patients are able to view lab/test results, encounter notes, upcoming appointments, etc.  Non-urgent messages can be sent to your provider as well.   To learn more about what you can do with MyChart, go to [email or web address].    Your next appointment:   4 -5 month(s)  The format for your next appointment:   In Person  Provider:   Gypsy Balsam, MD    Other Instructions NA
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
## 2023-03-28 NOTE — Addendum Note (Signed)
Addended by: Baldo Ash D on: 03/28/2023 11:02 AM   Modules accepted: Orders

## 2023-03-28 NOTE — Addendum Note (Signed)
Addended by: Baldo Ash D on: 03/28/2023 11:04 AM   Modules accepted: Orders

## 2023-05-06 LAB — LIPID PANEL
Chol/HDL Ratio: 4.6 {ratio} (ref 0.0–5.0)
Cholesterol, Total: 182 mg/dL (ref 100–199)
HDL: 40 mg/dL (ref >39)
LDL Chol Calc (NIH): 129 mg/dL — ABNORMAL HIGH (ref 0–99)
Triglycerides: 67 mg/dL (ref 0–149)
VLDL Cholesterol Cal: 13 mg/dL (ref 5–40)

## 2023-05-06 LAB — ALT: ALT: 25 [IU]/L (ref 0–44)

## 2023-05-06 LAB — AST: AST: 23 [IU]/L (ref 0–40)

## 2023-05-09 ENCOUNTER — Telehealth: Payer: Self-pay

## 2023-05-09 DIAGNOSIS — I2510 Atherosclerotic heart disease of native coronary artery without angina pectoris: Secondary | ICD-10-CM

## 2023-05-09 DIAGNOSIS — E785 Hyperlipidemia, unspecified: Secondary | ICD-10-CM

## 2023-05-09 MED ORDER — NEXLIZET 180-10 MG PO TABS: 1.0000 | ORAL_TABLET | Freq: Every day | ORAL | 3 refills | Status: DC

## 2023-05-09 NOTE — Telephone Encounter (Signed)
 MyChart message

## 2023-05-09 NOTE — Telephone Encounter (Signed)
-----   Message from Gypsy Balsam sent at 05/08/2023  9:55 AM EST ----- Cholesterol still not well-controlled.  Please switch Zetia to Nexlizet at, fasting lipid profile, AST ALT 6 weeks

## 2023-09-25 ENCOUNTER — Other Ambulatory Visit: Payer: Self-pay | Admitting: Cardiology

## 2024-01-30 NOTE — Addendum Note (Signed)
 Addended by: ONEITA BERLINER on: 01/30/2024 08:33 AM   Modules accepted: Orders

## 2024-01-30 NOTE — Telephone Encounter (Signed)
 Labs ordered as las order was expired.

## 2024-01-31 ENCOUNTER — Ambulatory Visit: Payer: Self-pay | Admitting: Cardiology

## 2024-01-31 LAB — LIPID PANEL
Chol/HDL Ratio: 4.4 ratio (ref 0.0–5.0)
Cholesterol, Total: 248 mg/dL — ABNORMAL HIGH (ref 100–199)
HDL: 57 mg/dL (ref >39)
LDL Chol Calc (NIH): 176 mg/dL — ABNORMAL HIGH (ref 0–99)
Triglycerides: 89 mg/dL (ref 0–149)
VLDL Cholesterol Cal: 15 mg/dL (ref 5–40)

## 2024-01-31 LAB — AST: AST: 24 IU/L (ref 0–40)

## 2024-01-31 LAB — ALT: ALT: 18 IU/L (ref 0–44)

## 2024-02-02 ENCOUNTER — Other Ambulatory Visit: Payer: Self-pay | Admitting: Cardiology

## 2024-02-06 ENCOUNTER — Encounter: Payer: Self-pay | Admitting: Cardiology

## 2024-02-06 ENCOUNTER — Ambulatory Visit: Attending: Cardiology | Admitting: Cardiology

## 2024-02-06 VITALS — BP 134/100 | HR 84 | Ht 76.0 in | Wt 177.0 lb

## 2024-02-06 DIAGNOSIS — R079 Chest pain, unspecified: Secondary | ICD-10-CM

## 2024-02-06 DIAGNOSIS — I2510 Atherosclerotic heart disease of native coronary artery without angina pectoris: Secondary | ICD-10-CM

## 2024-02-06 DIAGNOSIS — E785 Hyperlipidemia, unspecified: Secondary | ICD-10-CM | POA: Diagnosis not present

## 2024-02-06 MED ORDER — ISOSORBIDE MONONITRATE ER 30 MG PO TB24: 30.0000 mg | ORAL_TABLET | Freq: Every day | ORAL | 3 refills | Status: AC

## 2024-02-06 MED ORDER — NITROGLYCERIN 0.4 MG SL SUBL: 0.4000 mg | SUBLINGUAL_TABLET | SUBLINGUAL | 11 refills | Status: AC | PRN

## 2024-02-06 MED ORDER — EZETIMIBE 10 MG PO TABS: 10.0000 mg | ORAL_TABLET | Freq: Every day | ORAL | 3 refills | Status: AC

## 2024-02-06 NOTE — Patient Instructions (Addendum)
 Medication Instructions:   START: Nitroglycerin - Use nitroglycerin  1 tablet placed under the tongue at the first sign of chest pain or an angina attack. 1 tablet may be used every 5 minutes as needed, for up to 15 minutes. Do not take more than 3 tablets in 15 minutes. If pain persist call 911 or go to the nearest ED.    START: Imdur 30mg  1 tablet daily  START: Zetia 10mg  1 tablet daily   Lab Work: Your physician recommends that you return for lab work in: 6 weeks You need to have labs done when you are fasting.  You can come Monday through Friday 8:30 am to 12:00 pm and 1:15 to 4:30. You do not need to make an appointment as the order has already been placed.     Testing/Procedures: Your physician has requested that you have a Exercise Cardiolite. For further information please visit [email or web address] Please follow instruction sheet, as given.  The test will take approximately 3 to 4 hours to complete; you may bring reading material.  If someone comes with you to your appointment, they will need to remain in the main lobby due to limited space in the testing area.    How to prepare for your Myocardial Perfusion Test: Do not eat or drink 3 hours prior to your test, except you may have water. Do not consume products containing caffeine (regular or decaffeinated) 12 hours prior to your test. (ex: coffee, chocolate, sodas, tea). Do bring a list of your current medications with you.  If not listed below, you may take your medications as normal. Do wear comfortable clothes (no dresses or overalls) and walking shoes, tennis shoes preferred (No heels or open toe shoes are allowed). Do NOT wear cologne, perfume, aftershave, or lotions (deodorant is allowed). If these instructions are not followed, your test will have to be rescheduled.     Follow-Up: At [facility], you and your health needs are our priority.  As part of our continuing mission to provide you with exceptional heart care, we  have created designated Provider Care Teams.  These Care Teams include your primary Cardiologist (physician) and Advanced Practice Providers (APPs -  Physician Assistants and Nurse Practitioners) who all work together to provide you with the care you need, when you need it.  We recommend signing up for the patient portal called MyChart.  Sign up information is provided on this After Visit Summary.  MyChart is used to connect with patients for Virtual Visits (Telemedicine).  Patients are able to view lab/test results, encounter notes, upcoming appointments, etc.  Non-urgent messages can be sent to your provider as well.   To learn more about what you can do with MyChart, go to [email or web address].    Your next appointment:   2 month(s)  The format for your next appointment:   In Person  Provider:   Lamar Fitch, MD    Other Instructions NA
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
## 2024-02-13 ENCOUNTER — Telehealth: Payer: Self-pay | Admitting: *Deleted

## 2024-02-13 NOTE — Telephone Encounter (Signed)
 Pt given instructions for MPI study.

## 2024-02-20 ENCOUNTER — Ambulatory Visit: Attending: Cardiology

## 2024-02-20 ENCOUNTER — Other Ambulatory Visit: Payer: Self-pay | Admitting: Cardiology

## 2024-02-20 DIAGNOSIS — R079 Chest pain, unspecified: Secondary | ICD-10-CM

## 2024-02-20 MED ORDER — TECHNETIUM TC 99M TETROFOSMIN IV KIT
30.9000 | PACK | Freq: Once | INTRAVENOUS | Status: AC | PRN
  Administered 2024-02-20: 30.9 via INTRAVENOUS

## 2024-02-20 MED ORDER — REGADENOSON 0.4 MG/5ML IV SOLN
0.4000 mg | Freq: Once | INTRAVENOUS | Status: AC
  Administered 2024-02-20: 0.4 mg via INTRAVENOUS

## 2024-02-20 MED ORDER — TECHNETIUM TC 99M TETROFOSMIN IV KIT
10.4000 | PACK | Freq: Once | INTRAVENOUS | Status: AC | PRN
  Administered 2024-02-20: 10.4 via INTRAVENOUS

## 2024-02-21 DIAGNOSIS — M47816 Spondylosis without myelopathy or radiculopathy, lumbar region: Secondary | ICD-10-CM | POA: Insufficient documentation

## 2024-02-21 LAB — MYOCARDIAL PERFUSION IMAGING
Angina Index: 1
Estimated workload: 10.1
Exercise duration (min): 8 min
Exercise duration (sec): 54 s
LV dias vol: 130 mL (ref 62–150)
LV sys vol: 58 mL (ref 4.2–5.8)
MPHR: 165 {beats}/min
Nuc Stress EF: 56 %
Peak HR: 103 {beats}/min
Percent HR: 63 %
RPE: 20
Rest HR: 52 {beats}/min
Rest Nuclear Isotope Dose: 10.4 mCi
SDS: 4
SRS: 2
SSS: 6
Stress Nuclear Isotope Dose: 30.9 mCi
TID: 0.97

## 2024-02-22 ENCOUNTER — Ambulatory Visit: Payer: Self-pay | Admitting: Cardiology

## 2024-02-22 DIAGNOSIS — R413 Other amnesia: Secondary | ICD-10-CM | POA: Insufficient documentation

## 2024-03-19 DIAGNOSIS — J40 Bronchitis, not specified as acute or chronic: Secondary | ICD-10-CM | POA: Insufficient documentation

## 2024-03-19 DIAGNOSIS — J019 Acute sinusitis, unspecified: Secondary | ICD-10-CM | POA: Insufficient documentation

## 2024-03-21 ENCOUNTER — Other Ambulatory Visit: Payer: Self-pay | Admitting: Cardiology

## 2024-03-25 ENCOUNTER — Encounter: Payer: Self-pay | Admitting: *Deleted

## 2024-04-03 LAB — LIPID PANEL
Chol/HDL Ratio: 4.8 ratio (ref 0.0–5.0)
Cholesterol, Total: 209 mg/dL — ABNORMAL HIGH (ref 100–199)
HDL: 44 mg/dL
LDL Chol Calc (NIH): 131 mg/dL — ABNORMAL HIGH (ref 0–99)
Triglycerides: 191 mg/dL — ABNORMAL HIGH (ref 0–149)
VLDL Cholesterol Cal: 34 mg/dL (ref 5–40)

## 2024-04-08 ENCOUNTER — Ambulatory Visit: Attending: Cardiology | Admitting: Cardiology

## 2024-04-08 ENCOUNTER — Encounter: Payer: Self-pay | Admitting: Cardiology

## 2024-04-08 VITALS — BP 92/70 | HR 56 | Ht 76.0 in | Wt 183.0 lb

## 2024-04-08 DIAGNOSIS — I471 Supraventricular tachycardia, unspecified: Secondary | ICD-10-CM

## 2024-04-08 DIAGNOSIS — I2510 Atherosclerotic heart disease of native coronary artery without angina pectoris: Secondary | ICD-10-CM

## 2024-04-08 DIAGNOSIS — E785 Hyperlipidemia, unspecified: Secondary | ICD-10-CM

## 2024-04-08 DIAGNOSIS — R5383 Other fatigue: Secondary | ICD-10-CM

## 2024-04-08 NOTE — Patient Instructions (Signed)
 Medication Instructions:  Your physician recommends that you continue on your current medications as directed. Please refer to the Current Medication list given to you today.  *If you need a refill on your cardiac medications before your next appointment, please call your pharmacy*   Lab Work: Total Iron, IBC, Ferritin- today If you have labs (blood work) drawn today and your tests are completely normal, you will receive your results only by: MyChart Message (if you have MyChart) OR A paper copy in the mail If you have any lab test that is abnormal or we need to change your treatment, we will call you to review the results.   Testing/Procedures: None Ordered   Follow-Up: At [facility], you and your health needs are our priority.  As part of our continuing mission to provide you with exceptional heart care, we have created designated Provider Care Teams.  These Care Teams include your primary Cardiologist (physician) and Advanced Practice Providers (APPs -  Physician Assistants and Nurse Practitioners) who all work together to provide you with the care you need, when you need it.  We recommend signing up for the patient portal called MyChart.  Sign up information is provided on this After Visit Summary.  MyChart is used to connect with patients for Virtual Visits (Telemedicine).  Patients are able to view lab/test results, encounter notes, upcoming appointments, etc.  Non-urgent messages can be sent to your provider as well.   To learn more about what you can do with MyChart, go to [email or web address].    Your next appointment:   6 month(s)  The format for your next appointment:   In Person  Provider:   Lamar Fitch, MD    Other Instructions Pharm D- for cholesterol Medication
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
## 2024-04-09 LAB — IRON,TIBC AND FERRITIN PANEL
Ferritin: 21 ng/mL — ABNORMAL LOW (ref 30–400)
Iron Saturation: 9 % — CL (ref 15–55)
Iron: 35 ug/dL — ABNORMAL LOW (ref 38–169)
Total Iron Binding Capacity: 386 ug/dL (ref 250–450)
UIBC: 351 ug/dL — ABNORMAL HIGH (ref 111–343)

## 2024-04-10 ENCOUNTER — Telehealth: Payer: Self-pay

## 2024-04-10 ENCOUNTER — Ambulatory Visit: Admitting: Pharmacist Clinician (PhC)/ Clinical Pharmacy Specialist

## 2024-04-10 DIAGNOSIS — E611 Iron deficiency: Secondary | ICD-10-CM

## 2024-04-10 NOTE — Progress Notes (Deleted)
 "  Office Visit    Patient Name: Lucas Chase Date of Encounter: 04/10/2024  Primary Care Provider:  Ofilia Lamar CROME, MD Primary Cardiologist:  Lamar Fitch, MD  Chief Complaint    Hyperlipidemia - familial  Significant Past Medical History   CAD 2014 cath - 20% RCA and LAD  SVT Denies issues - on metoprolol   preDM 12/24 A1c 5.9           Allergies[1]  History of Present Illness    Lucas Chase is a 59 y.o. male patient of Dr Fitch, in the office today to discuss options for cholesterol management.   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 [] Yes [] No  Do you ever skip doses due to side effects? [] Yes [] No  Do you have trouble affording your medicines? [] Yes [] No  Are you ever unable to pick up your medication due to transportation difficulties? [] Yes [] No  Do you ever stop taking your medications because you don't believe they are helping? [] Yes [] No  Do you check your weight daily? [] Yes [] No   Adherence strategy: ***  Barriers to obtaining medications: ***     Accessory Clinical Findings   Lab Results  Component Value Date   CHOL 209 (H) 04/02/2024   HDL 44 04/02/2024   LDLCALC 131 (H) 04/02/2024   LDLDIRECT 212 (H) 03/14/2022   TRIG 191 (H) 04/02/2024   CHOLHDL 4.8 04/02/2024    No results found for: LIPOA  Lab Results  Component Value Date   ALT 18 01/30/2024   AST 24 01/30/2024   ALKPHOS 185 (H) 12/27/2022   BILITOT <0.2 12/27/2022   Lab Results  Component Value Date   CREATININE 0.96 09/28/2022   BUN 11 09/28/2022   NA 138 09/28/2022   K 4.5  09/28/2022   CL 100 09/28/2022   CO2 22 09/28/2022   No results found for: HGBA1C  Home Medications    Current Outpatient Medications  Medication Sig Dispense Refill   acetaminophen  (TYLENOL ) 500 MG tablet Take 1,000 mg by mouth 2 (two) times daily.     atorvastatin  (LIPITOR) 80 MG tablet TAKE 1 TABLET BY MOUTH EVERY DAY 90 tablet 3   buprenorphine (BUTRANS) 10 MCG/HR PTWK Place 1 patch onto the skin once a week.     clopidogrel  (PLAVIX ) 75 MG tablet Take 1 tablet (75 mg total) by mouth daily. 90 tablet 2   DULoxetine (CYMBALTA) 60 MG capsule Take 60 mg by mouth 2 (two) times daily.     ezetimibe  (ZETIA ) 10 MG tablet Take 1 tablet (10 mg total) by mouth daily. 90 tablet 3   isosorbide  mononitrate (IMDUR ) 30 MG 24 hr tablet Take 1 tablet (30 mg total) by mouth daily. 90 tablet 3   metoprolol  succinate (TOPROL -XL) 50 MG 24 hr tablet TAKE 1 TABLET BY MOUTH DAILY. TAKE WITH OR IMMEDIATELY FOLLOWING A MEAL. 90 tablet 3   nitroGLYCERIN  (NITROSTAT ) 0.4 MG SL tablet Place 1 tablet (0.4 mg total) under the tongue every 5 (five) minutes  as needed for chest pain. 25 tablet 11   pregabalin (LYRICA) 25 MG capsule Take 25 mg by mouth 2 (two) times daily.     traZODone (DESYREL) 100 MG tablet Take 200 mg by mouth at bedtime.     No current facility-administered medications for this visit.     Assessment & Plan    No problem-specific Assessment & Plan notes found for this encounter.   Kalesha Irving, PharmD CPP [facility] [address]   [place], [place] [address] [phone]  04/10/2024, 1:10 PM      [1]  Allergies Allergen Reactions   Wasp Venom Protein Anaphylaxis   "

## 2024-04-10 NOTE — Telephone Encounter (Signed)
 Referral to Dr. Ezzard for Iron Infusion per Dr. Bernie

## 2024-04-11 ENCOUNTER — Telehealth: Payer: Self-pay | Admitting: Hematology and Oncology

## 2024-04-11 NOTE — Telephone Encounter (Signed)
 04/11/24 Spoke with patient and scheduled New Hematology appt.

## 2024-04-15 ENCOUNTER — Other Ambulatory Visit (HOSPITAL_COMMUNITY): Payer: Self-pay

## 2024-04-15 ENCOUNTER — Telehealth: Payer: Self-pay | Admitting: Pharmacist Clinician (PhC)/ Clinical Pharmacy Specialist

## 2024-04-15 ENCOUNTER — Encounter: Payer: Self-pay | Admitting: Pharmacist Clinician (PhC)/ Clinical Pharmacy Specialist

## 2024-04-15 ENCOUNTER — Other Ambulatory Visit: Payer: Self-pay | Admitting: Hematology and Oncology

## 2024-04-15 ENCOUNTER — Telehealth: Payer: Self-pay | Admitting: Pharmacy Technician

## 2024-04-15 ENCOUNTER — Ambulatory Visit: Admitting: Pharmacist Clinician (PhC)/ Clinical Pharmacy Specialist

## 2024-04-15 DIAGNOSIS — E785 Hyperlipidemia, unspecified: Secondary | ICD-10-CM | POA: Diagnosis not present

## 2024-04-15 DIAGNOSIS — D649 Anemia, unspecified: Secondary | ICD-10-CM

## 2024-04-15 NOTE — Assessment & Plan Note (Signed)
 Assessment: Patient with amilial hyperlipidemia not at LDL goal of < 70 Most recent LDL 131 on 03/2024 Has been compliant with high intensity statin/ezetimibe  : atorvastatin  80 and ezetimibe  10 Reviewed options for lowering LDL cholesterol, including PCSK-9 inhibitors and inclisiran.  Discussed mechanisms of action, dosing, side effects, potential decreases in LDL cholesterol and costs.  Also reviewed potential options for patient assistance.  Plan: Patient agreeable to starting Repatha 140 mg q14d Repeat labs after:  3 months Lipid Liver function Patient was given information on manufacturer copay card
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
## 2024-04-15 NOTE — Telephone Encounter (Signed)
 Pharmacy Patient Advocate Encounter   Received notification from Physician's Office that prior authorization for Repatha is required/requested.   Insurance verification completed.   The patient is insured through [employer].   Per test claim: PA required; PA submitted to above mentioned insurance via Latent Key/confirmation #/EOC [id number] Status is pending

## 2024-04-15 NOTE — Telephone Encounter (Signed)
 Please do PA for Repatha - familial hyperlipidemia.

## 2024-04-15 NOTE — Patient Instructions (Signed)
 Your Results:             Your most recent labs Goal  Total Cholesterol 209 < 200  Triglycerides 191 < 150  HDL (happy/good cholesterol) 44 > 40  LDL (lousy/bad cholesterol 131 70   Medication changes:  We will start the process to get Repatha covered by your insurance.  Once the prior authorization is complete, I will call/send a MyChart message to let you know and confirm pharmacy information.   You will take one injection every 14 days  Please continue to take atorvastatin  and ezetimibe .  Lab orders:  We want to repeat labs after 2-3 months.  We will send you a lab order to remind you once we get closer to that time.    Repatha Co-pay Card Instructions 1.      Go to [email or web address]  2.      Click the red Repahta Co-Pay Card button near the top right 3.      Scroll down and click Yes, I have a Repatha prescription 4.      Continue to scroll down and selected [employer]  5.      Continue to scroll down and for the question Are you eligible for Medicare but receiving prescription drug coverage from a former employer, union, or welfare plan? select No 6.      Continue to scroll down and click the first box which is next to Repatha Co-Pay Card, and beneath that, select I want to enroll in the Repatha Co-Pay Card Program 7.      Continue to scroll down until you see the Next button, then click it 8.      Fill out your personal information then click the Next button    Thank you for choosing [facility]

## 2024-04-16 ENCOUNTER — Inpatient Hospital Stay: Attending: Hematology and Oncology | Admitting: Hematology and Oncology

## 2024-04-16 ENCOUNTER — Inpatient Hospital Stay

## 2024-04-16 ENCOUNTER — Encounter: Payer: Self-pay | Admitting: Gastroenterology

## 2024-04-16 ENCOUNTER — Other Ambulatory Visit: Payer: Self-pay | Admitting: Hematology and Oncology

## 2024-04-16 ENCOUNTER — Encounter: Payer: Self-pay | Admitting: Hematology and Oncology

## 2024-04-16 ENCOUNTER — Other Ambulatory Visit (HOSPITAL_COMMUNITY): Payer: Self-pay

## 2024-04-16 ENCOUNTER — Other Ambulatory Visit: Payer: Self-pay

## 2024-04-16 DIAGNOSIS — D649 Anemia, unspecified: Secondary | ICD-10-CM

## 2024-04-16 DIAGNOSIS — D500 Iron deficiency anemia secondary to blood loss (chronic): Secondary | ICD-10-CM | POA: Insufficient documentation

## 2024-04-16 LAB — CBC WITH DIFFERENTIAL (CANCER CENTER ONLY)
Abs Immature Granulocytes: 0.02 K/uL (ref 0.00–0.07)
Basophils Absolute: 0.1 K/uL (ref 0.0–0.1)
Basophils Relative: 1 %
Eosinophils Absolute: 0.1 K/uL (ref 0.0–0.5)
Eosinophils Relative: 2 %
HCT: 42.4 % (ref 39.0–52.0)
Hemoglobin: 13.7 g/dL (ref 13.0–17.0)
Immature Granulocytes: 0 %
Lymphocytes Relative: 20 %
Lymphs Abs: 1.8 K/uL (ref 0.7–4.0)
MCH: 30.8 pg (ref 26.0–34.0)
MCHC: 32.3 g/dL (ref 30.0–36.0)
MCV: 95.3 fL (ref 80.0–100.0)
Monocytes Absolute: 0.6 K/uL (ref 0.1–1.0)
Monocytes Relative: 6 %
Neutro Abs: 6.3 K/uL (ref 1.7–7.7)
Neutrophils Relative %: 71 %
Platelet Count: 236 K/uL (ref 150–400)
RBC: 4.45 MIL/uL (ref 4.22–5.81)
RDW: 13.1 % (ref 11.5–15.5)
WBC Count: 8.8 K/uL (ref 4.0–10.5)
nRBC: 0 % (ref 0.0–0.2)

## 2024-04-16 LAB — VITAMIN B12: Vitamin B-12: 180 pg/mL (ref 180–914)

## 2024-04-16 LAB — CMP (CANCER CENTER ONLY)
ALT: 20 U/L (ref 0–44)
AST: 33 U/L (ref 15–41)
Albumin: 4.3 g/dL (ref 3.5–5.0)
Alkaline Phosphatase: 156 U/L — ABNORMAL HIGH (ref 38–126)
Anion gap: 11 (ref 5–15)
BUN: 5 mg/dL — ABNORMAL LOW (ref 6–20)
CO2: 25 mmol/L (ref 22–32)
Calcium: 9.3 mg/dL (ref 8.9–10.3)
Chloride: 107 mmol/L (ref 98–111)
Creatinine: 0.77 mg/dL (ref 0.61–1.24)
GFR, Estimated: >60 mL/min
Glucose, Bld: 101 mg/dL — ABNORMAL HIGH (ref 70–99)
Potassium: 4.1 mmol/L (ref 3.5–5.1)
Sodium: 143 mmol/L (ref 135–145)
Total Bilirubin: 0.5 mg/dL (ref 0.0–1.2)
Total Protein: 6.8 g/dL (ref 6.5–8.1)

## 2024-04-16 LAB — IRON AND TIBC
Iron: 29 ug/dL — ABNORMAL LOW (ref 45–182)
Saturation Ratios: 7 % — ABNORMAL LOW (ref 17.9–39.5)
TIBC: 440 ug/dL (ref 250–450)
UIBC: 410 ug/dL

## 2024-04-16 LAB — TSH: TSH: 1.03 u[IU]/mL (ref 0.350–4.500)

## 2024-04-16 LAB — FERRITIN: Ferritin: 13 ng/mL — ABNORMAL LOW (ref 24–336)

## 2024-04-16 LAB — FOLATE: Folate: 11 ng/mL

## 2024-04-16 NOTE — Telephone Encounter (Signed)
 Pharmacy Patient Advocate Encounter  Received notification from [employer] that Prior Authorization for Repatha has been APPROVED from 04/16/24 to 04/15/25. Ran test claim, Copay is $35.00  one month. This test claim was processed through [facility]- copay amounts may vary at other pharmacies due to pharmacy/plan contracts, or as the patient moves through the different stages of their insurance plan.   PA #/Case ID/Reference #: [id number]
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
## 2024-04-19 ENCOUNTER — Inpatient Hospital Stay

## 2024-04-19 LAB — MULTIPLE MYELOMA PANEL, SERUM
Albumin SerPl Elph-Mcnc: 3.6 g/dL (ref 2.9–4.4)
Albumin/Glob SerPl: 1.2 (ref 0.7–1.7)
Alpha 1: 0.2 g/dL (ref 0.0–0.4)
Alpha2 Glob SerPl Elph-Mcnc: 0.7 g/dL (ref 0.4–1.0)
B-Globulin SerPl Elph-Mcnc: 1.1 g/dL (ref 0.7–1.3)
Gamma Glob SerPl Elph-Mcnc: 1 g/dL (ref 0.4–1.8)
Globulin, Total: 3.1 g/dL (ref 2.2–3.9)
IgA: 245 mg/dL (ref 90–386)
IgG (Immunoglobin G), Serum: 948 mg/dL (ref 603–1613)
IgM (Immunoglobulin M), Srm: 97 mg/dL (ref 20–172)
Total Protein ELP: 6.7 g/dL (ref 6.0–8.5)

## 2024-04-22 ENCOUNTER — Other Ambulatory Visit: Payer: Self-pay | Admitting: Pharmacist Clinician (PhC)/ Clinical Pharmacy Specialist

## 2024-04-22 ENCOUNTER — Encounter: Payer: Self-pay | Admitting: Pharmacist Clinician (PhC)/ Clinical Pharmacy Specialist

## 2024-04-22 DIAGNOSIS — E785 Hyperlipidemia, unspecified: Secondary | ICD-10-CM

## 2024-04-22 MED ORDER — REPATHA SURECLICK 140 MG/ML ~~LOC~~ SOAJ: 140.0000 mg | SUBCUTANEOUS | 3 refills | Status: AC

## 2024-04-22 NOTE — Addendum Note (Signed)
 Addended by: Kasandra Fehr L on: 04/22/2024 09:55 AM   Modules accepted: Orders

## 2024-04-23 ENCOUNTER — Inpatient Hospital Stay

## 2024-04-23 VITALS — BP 105/70 | HR 72 | Temp 98.0°F | Resp 18

## 2024-04-23 DIAGNOSIS — D500 Iron deficiency anemia secondary to blood loss (chronic): Secondary | ICD-10-CM

## 2024-04-23 MED ORDER — IRON SUCROSE 20 MG/ML IV SOLN
200.0000 mg | Freq: Once | INTRAVENOUS | Status: AC
  Administered 2024-04-23: 200 mg via INTRAVENOUS
  Filled 2024-04-23: qty 10

## 2024-04-23 NOTE — Patient Instructions (Signed)

## 2024-04-24 ENCOUNTER — Inpatient Hospital Stay

## 2024-04-24 VITALS — BP 132/86 | HR 74 | Temp 98.1°F | Resp 18

## 2024-04-24 DIAGNOSIS — D500 Iron deficiency anemia secondary to blood loss (chronic): Secondary | ICD-10-CM

## 2024-04-24 MED ORDER — SODIUM CHLORIDE 0.9 % IV SOLN: INTRAVENOUS | Status: DC

## 2024-04-24 MED ORDER — IRON SUCROSE 20 MG/ML IV SOLN
200.0000 mg | Freq: Once | INTRAVENOUS | Status: AC
  Administered 2024-04-24: 200 mg via INTRAVENOUS
  Filled 2024-04-24: qty 10

## 2024-04-24 NOTE — Patient Instructions (Signed)

## 2024-04-25 ENCOUNTER — Inpatient Hospital Stay

## 2024-04-25 VITALS — BP 132/99 | HR 78 | Temp 98.3°F | Resp 18 | Ht 76.0 in

## 2024-04-25 DIAGNOSIS — D500 Iron deficiency anemia secondary to blood loss (chronic): Secondary | ICD-10-CM

## 2024-04-25 MED ORDER — IRON SUCROSE 20 MG/ML IV SOLN
200.0000 mg | Freq: Once | INTRAVENOUS | Status: AC
  Administered 2024-04-25: 200 mg via INTRAVENOUS
  Filled 2024-04-25: qty 10

## 2024-04-26 ENCOUNTER — Ambulatory Visit: Admitting: Gastroenterology

## 2024-04-26 ENCOUNTER — Telehealth: Payer: Self-pay

## 2024-04-26 ENCOUNTER — Encounter: Payer: Self-pay | Admitting: Gastroenterology

## 2024-04-26 VITALS — BP 130/86 | HR 80 | Ht 76.0 in | Wt 181.4 lb

## 2024-04-26 DIAGNOSIS — K625 Hemorrhage of anus and rectum: Secondary | ICD-10-CM | POA: Diagnosis not present

## 2024-04-26 DIAGNOSIS — Z7902 Long term (current) use of antithrombotics/antiplatelets: Secondary | ICD-10-CM

## 2024-04-26 DIAGNOSIS — D509 Iron deficiency anemia, unspecified: Secondary | ICD-10-CM

## 2024-04-26 DIAGNOSIS — K649 Unspecified hemorrhoids: Secondary | ICD-10-CM | POA: Diagnosis not present
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
## 2024-04-26 NOTE — Telephone Encounter (Signed)
 Spoke to patient and told him that, per cardiology, he could hold his Plavix  for 5 days.  They recommend that he take aspirin during that time.  Patient agreed

## 2024-04-26 NOTE — Patient Instructions (Signed)
" °  Take a fiber supplement like Citrucel or Benefiber daily.  Take Calmol suppositories once or twice a day.  You can purchase them over the counter.   You have been scheduled for an endoscopy and colonoscopy. Please follow the written instructions given to you at your visit today.  If you use inhalers (even only as needed), please bring them with you on the day of your procedure.  DO NOT TAKE 7 DAYS PRIOR TO TEST- Trulicity (dulaglutide) Ozempic, Wegovy (semaglutide) Mounjaro, Zepbound (tirzepatide) Bydureon Bcise (exanatide extended release)  DO NOT TAKE 1 DAY PRIOR TO YOUR TEST Rybelsus (semaglutide) Adlyxin (lixisenatide) Victoza (liraglutide) Byetta (exanatide) ___________________________________________________________________________ _______________________________________________________  If your blood pressure at your visit was 140/90 or greater, please contact your primary care physician to follow up on this.  _______________________________________________________  If you are age 92 or older, your body mass index should be between 23-30. Your Body mass index is 22.08 kg/m. If this is out of the aforementioned range listed, please consider follow up with your Primary Care Provider.  If you are age 24 or younger, your body mass index should be between 19-25. Your Body mass index is 22.08 kg/m. If this is out of the aformentioned range listed, please consider follow up with your Primary Care Provider.   ________________________________________________________  The [facility] providers would like to encourage you to use MYCHART to communicate with providers for non-urgent requests or questions.  Due to long hold times on the telephone, sending your provider a message by [facility] may be a faster and more efficient way to get a response.  Please allow 48 business hours for a response.  Please remember that this is for non-urgent requests.   _______________________________________________________  [facility] is using a team-based approach to care.  Your team is made up of your doctor and two to three APPS. Our APPS (Nurse Practitioners and Physician Assistants) work with your physician to ensure care continuity for you. They are fully qualified to address your health concerns and develop a treatment plan. They communicate directly with your gastroenterologist to care for you. Seeing the Advanced Practice Practitioners on your physician's team can help you by facilitating care more promptly, often allowing for earlier appointments, access to diagnostic testing, procedures, and other specialty referrals.   "

## 2024-04-26 NOTE — Telephone Encounter (Signed)
 [facility] Pre-operative Risk Assessment     Request for surgical clearance:     Endoscopy Procedure  What type of surgery is being performed?     Colonoscopy/endoscopy  When is this surgery scheduled?     05/23/2024  What type of clearance is required ?   Pharmacy  Are there any medications that need to be held prior to surgery and how long? Plavix  - 5 days  Practice name and name of physician performing surgery?      [facility]  What is your office phone and fax number?      Phone- [phone]  Fax- [phone]  Anesthesia type (None, local, MAC, general) ?       MAC   Please route your response to [facility]

## 2024-04-26 NOTE — Telephone Encounter (Signed)
"  ° °  Patient Name: Lucas Chase  DOB: 1965/11/04 MRN: [id number]  Primary Cardiologist: Lamar Fitch, MD  Chart reviewed as part of pre-operative protocol coverage. Ok to hold plavix  for 5 days prior to procedure. Recommend having patient take aspirin 81 mg daily while off plavix , unless bleeding risk is felt to be high.    Rollo FABIENE Louder, PA-C 04/26/2024, 9:27 AM   "

## 2024-04-28 ENCOUNTER — Other Ambulatory Visit: Payer: Self-pay | Admitting: Cardiology

## 2024-04-29 ENCOUNTER — Encounter: Payer: Self-pay | Admitting: Hematology and Oncology

## 2024-04-30 ENCOUNTER — Inpatient Hospital Stay

## 2024-05-03 ENCOUNTER — Inpatient Hospital Stay

## 2024-05-03 VITALS — BP 118/87 | HR 73 | Temp 98.4°F | Resp 18 | Ht 76.0 in

## 2024-05-03 DIAGNOSIS — D500 Iron deficiency anemia secondary to blood loss (chronic): Secondary | ICD-10-CM

## 2024-05-03 MED ORDER — IRON SUCROSE 20 MG/ML IV SOLN
200.0000 mg | Freq: Once | INTRAVENOUS | Status: AC
  Administered 2024-05-03: 200 mg via INTRAVENOUS
  Filled 2024-05-03: qty 10

## 2024-05-03 NOTE — Patient Instructions (Signed)
 Iron  Sucrose Injection What is this medication? IRON  SUCROSE (EYE ern SOO krose) treats low levels of iron  (iron  deficiency anemia) in people with kidney disease. Iron  is a mineral that plays an important role in making red blood cells, which carry oxygen from your lungs to the rest of your body. This medicine may be used for other purposes; ask your health care provider or pharmacist if you have questions. COMMON BRAND NAME(S): Venofer  What should I tell my care team before I take this medication? They need to know if you have any of these conditions: Anemia not caused by low iron  levels Heart disease High levels of iron  in the blood Kidney disease Liver disease An unusual or allergic reaction to iron , other medications, foods, dyes, or preservatives Pregnant or trying to get pregnant Breastfeeding How should I use this medication? This medication is infused into a vein. It is given by your care team in a hospital or clinic setting. Talk to your care team about the use of this medication in children. While it may be prescribed for children as young as 2 years for selected conditions, precautions do apply. Overdosage: If you think you have taken too much of this medicine contact a poison control center or emergency room at once. NOTE: This medicine is only for you. Do not share this medicine with others. What if I miss a dose? Keep appointments for follow-up doses. It is important not to miss your dose. Call your care team if you are unable to keep an appointment. What may interact with this medication? Do not take this medication with any of the following: Deferoxamine Dimercaprol Other iron  products This medication may also interact with the following: Chloramphenicol Deferasirox This list may not describe all possible interactions. Give your health care provider a list of all the medicines, herbs, non-prescription drugs, or dietary supplements you use. Also tell them if you smoke,  drink alcohol, or use illegal drugs. Some items may interact with your medicine. What should I watch for while using this medication? Your condition will be monitored carefully while you are receiving this medication. Tell your care team if your symptoms do not start to get better or if they get worse. You may need blood work done while you are taking this medication. Sometimes, when medications are infused into veins, a little can leak out of the vein and into the tissue around it. If this medication leaks, it can cause a brown or dark stain on the skin. This is not common. It may be permanent. If you feel pain or swelling during your infusion, tell your care team right away. They can stop the infusion and treat the area. You may need to eat more foods that contain iron . Talk to your care team. Foods that contain iron  include whole grains or cereals, dried fruits, beans, peas, leafy green vegetables, and organ meats (liver, kidney). What side effects may I notice from receiving this medication? Side effects that you should report to your care team as soon as possible: Allergic reactions--skin rash, itching, hives, swelling of the face, lips, tongue, or throat Low blood pressure--dizziness, feeling faint or lightheaded, blurry vision Painful swelling, warmth, or redness of the skin, brown or dark skin color at the infusion site Shortness of breath Side effects that usually do not require medical attention (report these to your care team if they continue or are bothersome): Flushing Headache Joint pain Muscle pain Nausea This list may not describe all possible side effects. Call your  doctor for medical advice about side effects. You may report side effects to FDA at 1-800-FDA-1088. Where should I keep my medication? This medication is given in a hospital or clinic. It will not be stored at home. NOTE: This sheet is a summary. It may not cover all possible information. If you have questions about  this medicine, talk to your doctor, pharmacist, or health care provider.  2025 Elsevier/Gold Standard (2024-02-07 00:00:00)

## 2024-05-09 ENCOUNTER — Inpatient Hospital Stay

## 2024-05-09 VITALS — BP 133/90 | HR 74 | Temp 98.8°F | Resp 18

## 2024-05-09 DIAGNOSIS — D500 Iron deficiency anemia secondary to blood loss (chronic): Secondary | ICD-10-CM

## 2024-05-09 MED ORDER — IRON SUCROSE 20 MG/ML IV SOLN
200.0000 mg | Freq: Once | INTRAVENOUS | Status: AC
  Administered 2024-05-09: 200 mg via INTRAVENOUS
  Filled 2024-05-09: qty 10

## 2024-05-09 NOTE — Patient Instructions (Signed)
 Iron  Sucrose Injection What is this medication? IRON  SUCROSE (EYE ern SOO krose) treats low levels of iron  (iron  deficiency anemia) in people with kidney disease. Iron  is a mineral that plays an important role in making red blood cells, which carry oxygen from your lungs to the rest of your body. This medicine may be used for other purposes; ask your health care provider or pharmacist if you have questions. COMMON BRAND NAME(S): Venofer  What should I tell my care team before I take this medication? They need to know if you have any of these conditions: Anemia not caused by low iron  levels Heart disease High levels of iron  in the blood Kidney disease Liver disease An unusual or allergic reaction to iron , other medications, foods, dyes, or preservatives Pregnant or trying to get pregnant Breastfeeding How should I use this medication? This medication is infused into a vein. It is given by your care team in a hospital or clinic setting. Talk to your care team about the use of this medication in children. While it may be prescribed for children as young as 2 years for selected conditions, precautions do apply. Overdosage: If you think you have taken too much of this medicine contact a poison control center or emergency room at once. NOTE: This medicine is only for you. Do not share this medicine with others. What if I miss a dose? Keep appointments for follow-up doses. It is important not to miss your dose. Call your care team if you are unable to keep an appointment. What may interact with this medication? Do not take this medication with any of the following: Deferoxamine Dimercaprol Other iron  products This medication may also interact with the following: Chloramphenicol Deferasirox This list may not describe all possible interactions. Give your health care provider a list of all the medicines, herbs, non-prescription drugs, or dietary supplements you use. Also tell them if you smoke,  drink alcohol, or use illegal drugs. Some items may interact with your medicine. What should I watch for while using this medication? Your condition will be monitored carefully while you are receiving this medication. Tell your care team if your symptoms do not start to get better or if they get worse. You may need blood work done while you are taking this medication. Sometimes, when medications are infused into veins, a little can leak out of the vein and into the tissue around it. If this medication leaks, it can cause a brown or dark stain on the skin. This is not common. It may be permanent. If you feel pain or swelling during your infusion, tell your care team right away. They can stop the infusion and treat the area. You may need to eat more foods that contain iron . Talk to your care team. Foods that contain iron  include whole grains or cereals, dried fruits, beans, peas, leafy green vegetables, and organ meats (liver, kidney). What side effects may I notice from receiving this medication? Side effects that you should report to your care team as soon as possible: Allergic reactions--skin rash, itching, hives, swelling of the face, lips, tongue, or throat Low blood pressure--dizziness, feeling faint or lightheaded, blurry vision Painful swelling, warmth, or redness of the skin, brown or dark skin color at the infusion site Shortness of breath Side effects that usually do not require medical attention (report these to your care team if they continue or are bothersome): Flushing Headache Joint pain Muscle pain Nausea This list may not describe all possible side effects. Call your  doctor for medical advice about side effects. You may report side effects to FDA at 1-800-FDA-1088. Where should I keep my medication? This medication is given in a hospital or clinic. It will not be stored at home. NOTE: This sheet is a summary. It may not cover all possible information. If you have questions about  this medicine, talk to your doctor, pharmacist, or health care provider.  2025 Elsevier/Gold Standard (2024-02-07 00:00:00)

## 2024-05-09 NOTE — Progress Notes (Signed)
 Pt declined 30 min wait time, d/c stable

## 2024-05-10 ENCOUNTER — Telehealth: Payer: Self-pay | Admitting: Gastroenterology

## 2024-05-10 MED ORDER — SUTAB 1479-225-188 MG PO TABS: 1.0000 | ORAL_TABLET | ORAL | 0 refills | Status: AC

## 2024-05-10 NOTE — Telephone Encounter (Signed)
 Patient would like his prep medication sent to his pharmacy.Please Advise, Thank you.

## 2024-05-10 NOTE — Telephone Encounter (Signed)
 Prescription for Sutab  sent to patient's pharmacy.

## 2024-05-23 ENCOUNTER — Encounter: Admitting: Gastroenterology

## 2024-07-15 ENCOUNTER — Inpatient Hospital Stay

## 2024-07-15 ENCOUNTER — Inpatient Hospital Stay: Admitting: Hematology and Oncology
